# Patient Record
Sex: Female | Born: 1970 | Race: White | Hispanic: No | Marital: Married | Smoking: Never smoker
Health system: Southern US, Community
[De-identification: ages and names within clinical notes are randomized; demographics above are authoritative.]

## PROBLEM LIST (undated history)

## (undated) DIAGNOSIS — E785 Hyperlipidemia, unspecified: Secondary | ICD-10-CM

## (undated) DIAGNOSIS — F32A Depression, unspecified: Secondary | ICD-10-CM

## (undated) DIAGNOSIS — K5792 Diverticulitis of intestine, part unspecified, without perforation or abscess without bleeding: Secondary | ICD-10-CM

## (undated) DIAGNOSIS — R079 Chest pain, unspecified: Secondary | ICD-10-CM

## (undated) DIAGNOSIS — F419 Anxiety disorder, unspecified: Secondary | ICD-10-CM

## (undated) DIAGNOSIS — J40 Bronchitis, not specified as acute or chronic: Secondary | ICD-10-CM

## (undated) DIAGNOSIS — M549 Dorsalgia, unspecified: Secondary | ICD-10-CM

## (undated) DIAGNOSIS — E039 Hypothyroidism, unspecified: Secondary | ICD-10-CM

## (undated) DIAGNOSIS — F329 Major depressive disorder, single episode, unspecified: Secondary | ICD-10-CM

## (undated) DIAGNOSIS — J309 Allergic rhinitis, unspecified: Secondary | ICD-10-CM

## (undated) HISTORY — DX: Allergic rhinitis, unspecified: J30.9

## (undated) HISTORY — DX: Chest pain, unspecified: R07.9

## (undated) HISTORY — PX: BACK SURGERY: SHX140

## (undated) HISTORY — DX: Bronchitis, not specified as acute or chronic: J40

## (undated) HISTORY — DX: Hypothyroidism, unspecified: E03.9

---

## 1998-11-01 ENCOUNTER — Emergency Department (HOSPITAL_COMMUNITY): Admission: EM | Admit: 1998-11-01 | Discharge: 1998-11-01 | Payer: Self-pay | Admitting: Emergency Medicine

## 1999-07-07 ENCOUNTER — Other Ambulatory Visit: Admission: RE | Admit: 1999-07-07 | Discharge: 1999-07-07 | Payer: Self-pay | Admitting: Obstetrics and Gynecology

## 1999-11-09 ENCOUNTER — Emergency Department (HOSPITAL_COMMUNITY): Admission: EM | Admit: 1999-11-09 | Discharge: 1999-11-09 | Payer: Self-pay | Admitting: Emergency Medicine

## 2000-10-07 ENCOUNTER — Other Ambulatory Visit: Admission: RE | Admit: 2000-10-07 | Discharge: 2000-10-07 | Payer: Self-pay | Admitting: Obstetrics and Gynecology

## 2002-08-31 ENCOUNTER — Other Ambulatory Visit: Admission: RE | Admit: 2002-08-31 | Discharge: 2002-08-31 | Payer: Self-pay | Admitting: Obstetrics and Gynecology

## 2002-09-08 ENCOUNTER — Encounter: Payer: Self-pay | Admitting: Emergency Medicine

## 2002-09-08 ENCOUNTER — Emergency Department (HOSPITAL_COMMUNITY): Admission: EM | Admit: 2002-09-08 | Discharge: 2002-09-09 | Payer: Self-pay | Admitting: Emergency Medicine

## 2003-10-18 ENCOUNTER — Other Ambulatory Visit: Admission: RE | Admit: 2003-10-18 | Discharge: 2003-10-18 | Payer: Self-pay | Admitting: Obstetrics and Gynecology

## 2006-11-29 ENCOUNTER — Inpatient Hospital Stay (HOSPITAL_COMMUNITY): Admission: RE | Admit: 2006-11-29 | Discharge: 2006-12-02 | Payer: Self-pay | Admitting: Neurosurgery

## 2008-11-14 ENCOUNTER — Encounter: Admission: RE | Admit: 2008-11-14 | Discharge: 2008-11-14 | Payer: Self-pay | Admitting: Emergency Medicine

## 2010-02-11 ENCOUNTER — Encounter: Admission: RE | Admit: 2010-02-11 | Discharge: 2010-02-11 | Payer: Self-pay | Admitting: Otolaryngology

## 2010-05-06 ENCOUNTER — Inpatient Hospital Stay (HOSPITAL_COMMUNITY)
Admission: EM | Admit: 2010-05-06 | Discharge: 2010-05-13 | Payer: Self-pay | Source: Home / Self Care | Admitting: Emergency Medicine

## 2010-05-08 ENCOUNTER — Encounter (INDEPENDENT_AMBULATORY_CARE_PROVIDER_SITE_OTHER): Payer: Self-pay | Admitting: Gastroenterology

## 2010-10-29 LAB — DIFFERENTIAL
Basophils Absolute: 0 10*3/uL (ref 0.0–0.1)
Basophils Absolute: 0 10*3/uL (ref 0.0–0.1)
Basophils Relative: 0 % (ref 0–1)
Basophils Relative: 0 % (ref 0–1)
Basophils Relative: 1 % (ref 0–1)
Eosinophils Absolute: 0 10*3/uL (ref 0.0–0.7)
Lymphocytes Relative: 26 % (ref 12–46)
Lymphs Abs: 1.5 10*3/uL (ref 0.7–4.0)
Lymphs Abs: 1.6 10*3/uL (ref 0.7–4.0)
Monocytes Absolute: 0.8 10*3/uL (ref 0.1–1.0)
Monocytes Relative: 1 % — ABNORMAL LOW (ref 3–12)
Neutro Abs: 5.3 10*3/uL (ref 1.7–7.7)
Neutro Abs: 8.4 10*3/uL — ABNORMAL HIGH (ref 1.7–7.7)
Neutrophils Relative %: 64 % (ref 43–77)
Neutrophils Relative %: 70 % (ref 43–77)
Neutrophils Relative %: 83 % — ABNORMAL HIGH (ref 43–77)

## 2010-10-29 LAB — URINALYSIS, ROUTINE W REFLEX MICROSCOPIC
Bilirubin Urine: NEGATIVE
Specific Gravity, Urine: 1.011 (ref 1.005–1.030)
Urobilinogen, UA: 0.2 mg/dL (ref 0.0–1.0)
pH: 6.5 (ref 5.0–8.0)

## 2010-10-29 LAB — HEPATIC FUNCTION PANEL
AST: 21 U/L (ref 0–37)
Albumin: 3.6 g/dL (ref 3.5–5.2)
Alkaline Phosphatase: 45 U/L (ref 39–117)
Total Bilirubin: 0.2 mg/dL — ABNORMAL LOW (ref 0.3–1.2)

## 2010-10-29 LAB — COMPREHENSIVE METABOLIC PANEL
ALT: 17 U/L (ref 0–35)
Albumin: 2.7 g/dL — ABNORMAL LOW (ref 3.5–5.2)
Alkaline Phosphatase: 35 U/L — ABNORMAL LOW (ref 39–117)
BUN: 3 mg/dL — ABNORMAL LOW (ref 6–23)
CO2: 27 mEq/L (ref 19–32)
Calcium: 8.8 mg/dL (ref 8.4–10.5)
Chloride: 105 mEq/L (ref 96–112)
Creatinine, Ser: 1.13 mg/dL (ref 0.4–1.2)
GFR calc non Af Amer: >60 mL/min (ref >60)
Glucose, Bld: 108 mg/dL — ABNORMAL HIGH (ref 70–99)
Glucose, Bld: 119 mg/dL — ABNORMAL HIGH (ref 70–99)
Sodium: 139 mEq/L (ref 135–145)
Total Bilirubin: 0.2 mg/dL — ABNORMAL LOW (ref 0.3–1.2)
Total Bilirubin: 0.4 mg/dL (ref 0.3–1.2)
Total Protein: 6 g/dL (ref 6.0–8.3)

## 2010-10-29 LAB — CBC
HCT: 33 % — ABNORMAL LOW (ref 36.0–46.0)
HCT: 34.2 % — ABNORMAL LOW (ref 36.0–46.0)
Hemoglobin: 11.8 g/dL — ABNORMAL LOW (ref 12.0–15.0)
Hemoglobin: 13.8 g/dL (ref 12.0–15.0)
MCH: 28.6 pg (ref 26.0–34.0)
MCHC: 34.4 g/dL (ref 30.0–36.0)
MCV: 84.1 fL (ref 78.0–100.0)
Platelets: 301 10*3/uL (ref 150–400)
RBC: 4.72 MIL/uL (ref 3.87–5.11)
RDW: 12.7 % (ref 11.5–15.5)

## 2010-10-29 LAB — BASIC METABOLIC PANEL
CO2: 28 mEq/L (ref 19–32)
Calcium: 8.5 mg/dL (ref 8.4–10.5)
Calcium: 8.8 mg/dL (ref 8.4–10.5)
Calcium: 9 mg/dL (ref 8.4–10.5)
Creatinine, Ser: 0.87 mg/dL (ref 0.4–1.2)
GFR calc Af Amer: >60 mL/min (ref >60)
GFR calc Af Amer: >60 mL/min (ref >60)
GFR calc Af Amer: >60 mL/min (ref >60)
GFR calc non Af Amer: >60 mL/min (ref >60)
GFR calc non Af Amer: >60 mL/min (ref >60)
GFR calc non Af Amer: >60 mL/min (ref >60)
Glucose, Bld: 95 mg/dL (ref 70–99)
Potassium: 2.9 mEq/L — ABNORMAL LOW (ref 3.5–5.1)
Sodium: 135 mEq/L (ref 135–145)
Sodium: 141 mEq/L (ref 135–145)
Sodium: 141 mEq/L (ref 135–145)

## 2010-10-29 LAB — STOOL CULTURE

## 2010-10-29 LAB — URINE CULTURE
Colony Count: NO GROWTH
Culture: NO GROWTH

## 2010-10-29 LAB — HEMOCCULT GUIAC POC 1CARD (OFFICE)
Fecal Occult Bld: NEGATIVE
Fecal Occult Bld: NEGATIVE

## 2010-10-29 LAB — CLOSTRIDIUM DIFFICILE BY PCR: Toxigenic C. Difficile by PCR: NOT DETECTED

## 2010-10-29 LAB — CLOSTRIDIUM DIFFICILE EIA

## 2010-10-29 LAB — URINE MICROSCOPIC-ADD ON

## 2011-01-01 NOTE — Op Note (Signed)
NAMESHERRAN, MARGOLIS               ACCOUNT NO.:  [id number]   MEDICAL RECORD NO.:  [id number]          PATIENT TYPE:  INP   LOCATION:  2899                         FACILITY:  [facility]   PHYSICIAN:  Danae Orleans. Venetia Maxon, M.D.  DATE OF BIRTH:  1970-12-26   DATE OF PROCEDURE:  11/29/2006  DATE OF DISCHARGE:                               OPERATIVE REPORT   PREOPERATIVE DIAGNOSES:  1. L5-S1 spondylolisthesis with spondylosis.  2. Degenerative disk disease.  3. Stenosis and radiculopathy.   POSTOPERATIVE DIAGNOSES:  1. L5-S1 spondylolisthesis with spondylosis.  2. Degenerative disk disease.  3. Stenosis and radiculopathy.   OPERATION/PROCEDURE:  1. L5 Gill procedure.  2. to L5-S1 diskectomy.  3. L5-S1 transforaminal lumbar interbody fusion with PEEK interbody      cage of morcellized bone autograft.  4. Nonsegmental pedicle screw fixation L5-S1 levels bilaterally.  5. Posterolateral arthrodesis.   SURGEON:  Danae Orleans. Venetia Maxon, M.D.   ASSISTANT:  Georgiann Cocker, RN.  Hewitt Shorts, M.D.   ANESTHESIA:  General endotracheal anesthesia.   ESTIMATED BLOOD LOSS:  250 mL.   COMPLICATIONS:  None.   DISPOSITION:  To recovery.   INDICATIONS:  Breanna [name] is a 40 year old woman with grade 2  spondylolisthesis of L5-S1 with spondolysis of L5 and significant  foraminal disk herniation, right greater than left causing right L5  radiculopathy and to a lesser degree, left-sided leg pain.  It was  elected to take her to surgery for decompression and fusion of this  effected level.   DESCRIPTION OF PROCEDURE:  The patient was brought to the operating  room.  Following satisfactory, uncomplicated induction of general  endotracheal anesthesia plus intravenous lines, the patient was placed  in a prone position on the chest rolls.  Her low back was then prepped  and draped in the usual sterile fashion.  The area of planned incision  was infiltrated with 0.25% Marcaine and lidocaine with  1:200,000  epinephrine.   Incision was made in the midline of the low back and carried to the  lumbodorsal fascia which was approximately 5 inches in size.  The  subperiosteal dissection was performed exposing the bilateral sacral  alae and the L5 the transverse processes bilaterally.  Self-retaining  retractor was placed to facilitate exposure.  Intraoperative x-rays was  performed and confirmed correct level.  Subsequently, and L5 Gill  procedure was performed with the removal of the posterior elements of L5  five which were completely loose.  These were then removed and bone was  then cleared of investing soft tissue and prepared for later use in bone  grafting.  Both the L5 nerve roots very carefully dissected free of  investing tissue and they were decompressed widely as it extended out  the neural foramina bilaterally.  Subsequently on the right side of  midline the interspace was incised and this radical diskectomy was  performed along with preparing the end plates of both L5 and of S1 for  later  graft.  After trial sizing, elected to use a 9-mm TLIF  interbody  cage which was selected, packed with morcellized bone  autograft and  ostia cell.  Additionally, additional bone graft was placed deep in the  interspace and subsequently the interbody graft was placed and rotated  to a transverse position and then additional bone graft was placed in  the interspace and tamped into position.  Subsequently, pedicle screw  fixation was placed at L5 through S1 levels bilaterally using  fluoroscopy and confirming positioning of the screws on the AP and  lateral films.  Also there was no evidence of any cutouts on palpation  with ball probe on retracted pedicle screws. The 45 x 6.5 mL sacral  screws were placed and 50 mm x 6.5-mm screws were placed at the level of  L5-S1.  All screws had excellent purchase and their positioning appeared  to be solidly in bone.  Bicortical fixation was achieved  in the sacrum.  Subsequently, the lateral recesses __________ to the posterolateral  region of L5-S1 had been decorded prior to placing the screws and the  remaining bone graft was packed on the left side midline with bone tamp.  35-mm rods placed and locked in situ with a locking cap __________ .  Subsequently the self-retaining retractor was removed and the  lumbodorsal fascia was closed with #1 Vicryl sutures.  Subcutaneous  tissue were approximated 2-0 Vicryl interrupted inverted sutures and  skin edges were reapproximated with interrupted 3-0 Vicryl subcu stitch.  The wound was dressed with Benzoin, Steri-Strips, Telfa gauze, and tape.  The patient was extubated in the operating room and taken to the  recovery room recovery in stable and satisfactory condition.      Danae Orleans. Venetia Maxon, M.D.  Electronically Signed     JDS/MEDQ  D:  11/29/2006  T:  11/29/2006  Job:  [id number]

## 2011-01-01 NOTE — Discharge Summary (Signed)
[name], [name]               ACCOUNT NO.:  [id number]   MEDICAL RECORD NO.:  [id number]          PATIENT TYPE:  INP   LOCATION:  3035                         FACILITY:  [facility]   PHYSICIAN:  Danae Orleans. Venetia Maxon, M.D.  DATE OF BIRTH:  March 13, 1971   DATE OF ADMISSION:  11/29/2006  DATE OF DISCHARGE:  12/02/2006                               DISCHARGE SUMMARY   REASON FOR ADMISSION:  1. Lumbar spondylolisthesis.  2. Lumbar spondylosis.  3. Lumbar disk degeneration.   ADDITIONAL DIAGNOSES:  1. Tachycardia, NOS.  2. Obesity.  3. Unspecified asthma without status asthmaticus.  4. Esophageal reflux.  5. Panexs disorder.   FINAL DIAGNOSES:  1. Lumbar spondylolisthesis.  2. Lumbar spondylosis.  3. Lumbar disk degeneration.  4. Tachycardia, NOS.  5. Obesity.  6. Unspecified asthma without status asthmaticus.  7. Esophageal reflux.  8. Panexs disorder.   HISTORY OF ILLNESS AND HOSPITAL COURSE:  Breanna [name] is a 40 year old  woman with low back and right leg pain.  She has grade 2  spondylolisthesis of L5 and S1 with marked foraminal stenosis on the  right of L5 and S1 and significant L5 radiculopathy.  She was admitted  to the hospital for L5 gill procedure, L5-S1 decompression and fusion.  Postoperatively, she did well.  She had some tachycardia.  She was  nauseated with morphine and changed to PCA with Dilaudid.  She was  gradually mobilized to physical therapy.  Her tachycardia improved.  She  was taking Percocet orally for pain and was doing well.  On the 18th,  she was discharged home with Percocet and Valium with a rolling walker  and instructions to follow up in the office in 3-4 weeks with lumbar  radiographs.  She was instructed to wear her brace when up.      Danae Orleans. Venetia Maxon, M.D.  Electronically Signed     JDS/MEDQ  D:  02/02/2007  T:  02/02/2007  Job:  [id number]

## 2011-08-26 ENCOUNTER — Emergency Department (HOSPITAL_BASED_OUTPATIENT_CLINIC_OR_DEPARTMENT_OTHER)
Admission: EM | Admit: 2011-08-26 | Discharge: 2011-08-26 | Disposition: A | Discharge status: Home / Self Care | Payer: BC Managed Care – PPO | Attending: Emergency Medicine | Admitting: Emergency Medicine

## 2011-08-26 ENCOUNTER — Encounter (HOSPITAL_BASED_OUTPATIENT_CLINIC_OR_DEPARTMENT_OTHER): Payer: Self-pay | Admitting: *Deleted

## 2011-08-26 ENCOUNTER — Other Ambulatory Visit: Payer: Self-pay

## 2011-08-26 ENCOUNTER — Emergency Department (INDEPENDENT_AMBULATORY_CARE_PROVIDER_SITE_OTHER): Payer: BC Managed Care – PPO

## 2011-08-26 DIAGNOSIS — R079 Chest pain, unspecified: Secondary | ICD-10-CM

## 2011-08-26 DIAGNOSIS — F341 Dysthymic disorder: Secondary | ICD-10-CM | POA: Insufficient documentation

## 2011-08-26 DIAGNOSIS — K219 Gastro-esophageal reflux disease without esophagitis: Secondary | ICD-10-CM | POA: Insufficient documentation

## 2011-08-26 DIAGNOSIS — E785 Hyperlipidemia, unspecified: Secondary | ICD-10-CM | POA: Insufficient documentation

## 2011-08-26 DIAGNOSIS — R55 Syncope and collapse: Secondary | ICD-10-CM | POA: Insufficient documentation

## 2011-08-26 HISTORY — DX: Major depressive disorder, single episode, unspecified: F32.9

## 2011-08-26 HISTORY — DX: Dorsalgia, unspecified: M54.9

## 2011-08-26 HISTORY — DX: Depression, unspecified: F32.A

## 2011-08-26 HISTORY — DX: Anxiety disorder, unspecified: F41.9

## 2011-08-26 HISTORY — DX: Hyperlipidemia, unspecified: E78.5

## 2011-08-26 LAB — COMPREHENSIVE METABOLIC PANEL
ALT: 12 U/L (ref 0–35)
Albumin: 3.8 g/dL (ref 3.5–5.2)
Calcium: 9.3 mg/dL (ref 8.4–10.5)
GFR calc Af Amer: >90 mL/min (ref >90)
Glucose, Bld: 93 mg/dL (ref 70–99)
Sodium: 140 mEq/L (ref 135–145)
Total Protein: 7.2 g/dL (ref 6.0–8.3)

## 2011-08-26 LAB — CARDIAC PANEL(CRET KIN+CKTOT+MB+TROPI)
CK, MB: 3.4 ng/mL (ref 0.3–4.0)
CK, MB: 3.1 ng/mL (ref 0.3–4.0)
Total CK: 263 U/L — ABNORMAL HIGH (ref 7–177)
Troponin I: <0.3 ng/mL (ref <0.30)
Troponin I: <0.3 ng/mL (ref <0.30)

## 2011-08-26 LAB — CBC
Hemoglobin: 12.2 g/dL (ref 12.0–15.0)
MCH: 27.5 pg (ref 26.0–34.0)
MCHC: 33.2 g/dL (ref 30.0–36.0)
Platelets: 372 10*3/uL (ref 150–400)
RDW: 13.9 % (ref 11.5–15.5)

## 2011-08-26 LAB — URINALYSIS, ROUTINE W REFLEX MICROSCOPIC
Bilirubin Urine: NEGATIVE
Leukocytes, UA: NEGATIVE
Nitrite: NEGATIVE
Specific Gravity, Urine: 1.006 (ref 1.005–1.030)
pH: 8 (ref 5.0–8.0)

## 2011-08-26 LAB — URINE MICROSCOPIC-ADD ON

## 2011-08-26 MED ORDER — POTASSIUM CHLORIDE CRYS ER 20 MEQ PO TBCR
40.0000 meq | EXTENDED_RELEASE_TABLET | Freq: Once | ORAL | Status: AC
  Administered 2011-08-26: 40 meq via ORAL
  Filled 2011-08-26: qty 2

## 2011-08-26 MED ORDER — FAMOTIDINE 20 MG PO TABS: 20.0000 mg | ORAL_TABLET | Freq: Two times a day (BID) | ORAL | Status: DC

## 2011-08-26 MED ORDER — FAMOTIDINE IN NACL 20-0.9 MG/50ML-% IV SOLN
20.0000 mg | Freq: Once | INTRAVENOUS | Status: AC
  Administered 2011-08-26: 20 mg via INTRAVENOUS
  Filled 2011-08-26: qty 50

## 2011-08-26 NOTE — ED Notes (Signed)
Brought  In by EMS for c/o chest pain x 45 mins

## 2011-08-26 NOTE — ED Notes (Signed)
Pt received 1 nitro in rout with asa 324mg 

## 2011-08-26 NOTE — ED Provider Notes (Signed)
History     CSN: [id number]  Arrival date & time 08/26/11  1419   First MD Initiated Contact with Patient 08/26/11 1452      Chief Complaint  Patient presents with  . Chest Pain    (Consider location/radiation/quality/duration/timing/severity/associated sxs/prior treatment) HPI Comments: Sx began when at work.  States that she was seated when the pain began.  Remained localized to the areas described  Patient is a 41 y.o. female presenting with chest pain. The history is provided by the patient. No language interpreter was used.  Chest Pain The chest pain began 1 - 2 hours ago. Duration of episode(s) is 30 minutes. Chest pain occurs constantly. The chest pain is improving. The severity of the pain is moderate. The quality of the pain is described as pressure-like and aching (L and substernal chest/epigastrium). The pain does not radiate. Primary symptoms include palpitations. Pertinent negatives for primary symptoms include no fever, no fatigue, no syncope, no shortness of breath, no cough, no wheezing, no abdominal pain, no nausea, no vomiting, no dizziness and no altered mental status.  The palpitations did not occur with dizziness or shortness of breath.  Associated symptoms include near-syncope.  Pertinent negatives for associated symptoms include no diaphoresis, no lower extremity edema, no numbness, no orthopnea and no weakness. She tried nitroglycerin and aspirin for the symptoms. Risk factors include obesity.     Past Medical History  Diagnosis Date  . Anxiety   . Depressed   . Back pain   . Hyperlipemia     History reviewed. No pertinent past surgical history.  History reviewed. No pertinent family history.  History  Substance Use Topics  . Smoking status: Never Smoker   . Smokeless tobacco: Not on file  . Alcohol Use: No    OB History    Grav Para Term Preterm Abortions TAB SAB Ect Mult Living                  Review of Systems  Constitutional: Negative  for fever, diaphoresis, activity change, appetite change and fatigue.  HENT: Positive for congestion. Negative for sore throat, rhinorrhea, neck pain and neck stiffness.   Respiratory: Negative for cough, shortness of breath and wheezing.   Cardiovascular: Positive for chest pain, palpitations and near-syncope. Negative for orthopnea and syncope.  Gastrointestinal: Negative for nausea, vomiting and abdominal pain.  Genitourinary: Negative for dysuria, urgency, frequency and flank pain.  Neurological: Negative for dizziness, weakness, light-headedness, numbness and headaches.  Psychiatric/Behavioral: Negative for altered mental status.  All other systems reviewed and are negative.    Allergies  Review of patient's allergies indicates no known allergies.  Home Medications   Current Outpatient Rx  Name Route Sig Dispense Refill  . AMITRIPTYLINE HCL 50 MG PO TABS Oral Take 50 mg by mouth at bedtime.    . BUPROPION HCL 75 MG PO TABS Oral Take 75 mg by mouth 2 (two) times daily.    Marland Kitchen CETIRIZINE HCL 10 MG PO TABS Oral Take 10 mg by mouth daily.    . ADULT MULTIVITAMIN W/MINERALS CH Oral Take 1 tablet by mouth daily.    Marland Kitchen OMEPRAZOLE 20 MG PO CPDR Oral Take 20 mg by mouth daily.    Marland Kitchen OVER THE COUNTER MEDICATION Oral Take 2 tablets by mouth daily. Acetaminophen 325 mg, Guaifenesis 200 mg, and Phenylephrine 5 mg    . SIMVASTATIN 40 MG PO TABS Oral Take 40 mg by mouth every evening.    Marland Kitchen FAMOTIDINE 20 MG  PO TABS Oral Take 1 tablet (20 mg total) by mouth 2 (two) times daily. 30 tablet 0    BP 156/90  Pulse 96  Temp(Src) 98.5 F (36.9 C) (Oral)  Resp 18  SpO2 100%  LMP 08/24/2011  Physical Exam  Nursing note and vitals reviewed. Constitutional: She is oriented to person, place, and time. She appears well-developed and well-nourished. No distress.  HENT:  Head: Normocephalic and atraumatic.  Mouth/Throat: Oropharynx is clear and moist.  Eyes: Conjunctivae and EOM are normal. Pupils are  equal, round, and reactive to light.  Neck: Normal range of motion. Neck supple.  Cardiovascular: Normal rate, regular rhythm, normal heart sounds and intact distal pulses.  Exam reveals no gallop and no friction rub.   No murmur heard. Pulmonary/Chest: Effort normal and breath sounds normal. No respiratory distress. She has no wheezes. She exhibits no tenderness.  Abdominal: Soft. Bowel sounds are normal. There is no tenderness.  Musculoskeletal: Normal range of motion. She exhibits no edema and no tenderness.  Neurological: She is alert and oriented to person, place, and time. No cranial nerve deficit.  Skin: Skin is warm and dry. No rash noted.    ED Course  Procedures (including critical care time)   Date: 08/26/2011  Rate: 94  Rhythm: normal sinus rhythm  QRS Axis: normal  Intervals: normal  ST/T Wave abnormalities: normal  Conduction Disutrbances:none  Narrative Interpretation:   Old EKG Reviewed: none available  Labs Reviewed  CARDIAC PANEL(CRET KIN+CKTOT+MB+TROPI) - Abnormal; Notable for the following:    Total CK 263 (*)    All other components within normal limits  COMPREHENSIVE METABOLIC PANEL - Abnormal; Notable for the following:    Potassium 3.2 (*)    Total Bilirubin 0.2 (*)    All other components within normal limits  URINALYSIS, ROUTINE W REFLEX MICROSCOPIC - Abnormal; Notable for the following:    Hgb urine dipstick MODERATE (*)    All other components within normal limits  CARDIAC PANEL(CRET KIN+CKTOT+MB+TROPI) - Abnormal; Notable for the following:    Total CK 248 (*)    All other components within normal limits  CBC  URINE MICROSCOPIC-ADD ON   Dg Chest 2 View  08/26/2011  *RADIOLOGY REPORT*  Clinical Data: Mid chest pain.  Nonsmoker.  CHEST - 2 VIEW  Comparison: None.  Findings: Midline trachea.  Normal heart size and mediastinal contours. No pleural effusion or pneumothorax.  Clear lungs.  Numerous leads and wires project over the chest.  IMPRESSION:  Normal chest.  Original Report Authenticated By: Consuello Bossier, M.D.     1. Chest pain   2. GERD (gastroesophageal reflux disease)       MDM  Low concern for acute coronary syndrome as a cause for chest pain. EKG is normal. Delta troponin normal. Patient has perked negative with low clinical gestalt for PE. Chest x-ray also unremarkable. I feel her symptoms are secondary to anxiety as well as reflux. She'll be placed on Pepcid to be added to Prilosec. Should followup with primary care physician next week. Provided clear signs and symptoms for which to return to the emergency department        Dayton Bailiff, MD 08/26/11 1811

## 2014-02-27 ENCOUNTER — Other Ambulatory Visit: Payer: Self-pay | Admitting: Obstetrics and Gynecology

## 2014-02-27 DIAGNOSIS — N644 Mastodynia: Secondary | ICD-10-CM

## 2014-02-27 DIAGNOSIS — N6452 Nipple discharge: Secondary | ICD-10-CM

## 2014-03-07 ENCOUNTER — Ambulatory Visit
Admission: RE | Admit: 2014-03-07 | Discharge: 2014-03-07 | Disposition: A | Discharge status: Home / Self Care | Payer: BC Managed Care – PPO | Source: Ambulatory Visit | Attending: Obstetrics and Gynecology | Admitting: Obstetrics and Gynecology

## 2014-03-07 DIAGNOSIS — N6452 Nipple discharge: Secondary | ICD-10-CM

## 2014-03-07 DIAGNOSIS — N644 Mastodynia: Secondary | ICD-10-CM

## 2014-04-01 ENCOUNTER — Other Ambulatory Visit: Payer: Self-pay | Admitting: Obstetrics and Gynecology

## 2014-04-01 DIAGNOSIS — R7989 Other specified abnormal findings of blood chemistry: Secondary | ICD-10-CM

## 2014-04-01 DIAGNOSIS — E229 Hyperfunction of pituitary gland, unspecified: Principal | ICD-10-CM

## 2014-04-04 ENCOUNTER — Ambulatory Visit
Admission: RE | Admit: 2014-04-04 | Discharge: 2014-04-04 | Disposition: A | Discharge status: Home / Self Care | Payer: BC Managed Care – PPO | Source: Ambulatory Visit | Attending: Obstetrics and Gynecology | Admitting: Obstetrics and Gynecology

## 2014-04-04 DIAGNOSIS — R7989 Other specified abnormal findings of blood chemistry: Secondary | ICD-10-CM

## 2014-04-04 DIAGNOSIS — E229 Hyperfunction of pituitary gland, unspecified: Principal | ICD-10-CM

## 2014-04-04 MED ORDER — GADOBENATE DIMEGLUMINE 529 MG/ML IV SOLN
8.0000 mL | Freq: Once | INTRAVENOUS | Status: AC | PRN
  Administered 2014-04-04: 8 mL via INTRAVENOUS

## 2014-11-21 ENCOUNTER — Other Ambulatory Visit: Payer: Self-pay | Admitting: Endocrinology

## 2014-11-21 DIAGNOSIS — D444 Neoplasm of uncertain behavior of craniopharyngeal duct: Principal | ICD-10-CM

## 2014-11-21 DIAGNOSIS — D443 Neoplasm of uncertain behavior of pituitary gland: Secondary | ICD-10-CM

## 2014-12-01 ENCOUNTER — Ambulatory Visit
Admission: RE | Admit: 2014-12-01 | Discharge: 2014-12-01 | Disposition: A | Discharge status: Home / Self Care | Payer: BC Managed Care – PPO | Source: Ambulatory Visit | Attending: Endocrinology | Admitting: Endocrinology

## 2014-12-01 DIAGNOSIS — D443 Neoplasm of uncertain behavior of pituitary gland: Secondary | ICD-10-CM

## 2014-12-01 DIAGNOSIS — D444 Neoplasm of uncertain behavior of craniopharyngeal duct: Principal | ICD-10-CM

## 2014-12-01 MED ORDER — GADOBENATE DIMEGLUMINE 529 MG/ML IV SOLN
9.0000 mL | Freq: Once | INTRAVENOUS | Status: AC | PRN
  Administered 2014-12-01: 9 mL via INTRAVENOUS

## 2015-05-20 ENCOUNTER — Other Ambulatory Visit: Payer: Self-pay | Admitting: Obstetrics and Gynecology

## 2015-05-21 LAB — CYTOLOGY - PAP

## 2016-12-17 ENCOUNTER — Other Ambulatory Visit: Payer: Self-pay | Admitting: Endocrinology

## 2016-12-17 DIAGNOSIS — D443 Neoplasm of uncertain behavior of pituitary gland: Secondary | ICD-10-CM

## 2016-12-17 DIAGNOSIS — D444 Neoplasm of uncertain behavior of craniopharyngeal duct: Principal | ICD-10-CM

## 2016-12-28 ENCOUNTER — Other Ambulatory Visit: Payer: BC Managed Care – PPO

## 2017-01-03 ENCOUNTER — Ambulatory Visit
Admission: RE | Admit: 2017-01-03 | Discharge: 2017-01-03 | Disposition: A | Discharge status: Home / Self Care | Payer: BC Managed Care – PPO | Source: Ambulatory Visit | Attending: Endocrinology | Admitting: Endocrinology

## 2017-01-03 DIAGNOSIS — D444 Neoplasm of uncertain behavior of craniopharyngeal duct: Principal | ICD-10-CM

## 2017-01-03 DIAGNOSIS — D443 Neoplasm of uncertain behavior of pituitary gland: Secondary | ICD-10-CM

## 2017-01-03 MED ORDER — GADOBENATE DIMEGLUMINE 529 MG/ML IV SOLN
8.0000 mL | Freq: Once | INTRAVENOUS | Status: AC | PRN
  Administered 2017-01-03: 8 mL via INTRAVENOUS

## 2017-01-26 ENCOUNTER — Ambulatory Visit
Admission: RE | Admit: 2017-01-26 | Discharge: 2017-01-26 | Disposition: A | Discharge status: Home / Self Care | Payer: BC Managed Care – PPO | Source: Ambulatory Visit | Attending: Allergy | Admitting: Allergy

## 2017-01-26 ENCOUNTER — Other Ambulatory Visit: Payer: Self-pay | Admitting: Allergy

## 2017-01-26 DIAGNOSIS — R059 Cough, unspecified: Secondary | ICD-10-CM

## 2017-01-26 DIAGNOSIS — R05 Cough: Secondary | ICD-10-CM

## 2017-02-03 ENCOUNTER — Encounter (HOSPITAL_COMMUNITY): Payer: Self-pay | Admitting: Emergency Medicine

## 2017-02-03 ENCOUNTER — Inpatient Hospital Stay (HOSPITAL_COMMUNITY)
Admission: EM | Admit: 2017-02-03 | Discharge: 2017-02-09 | DRG: 378 | Disposition: A | Discharge status: Home / Self Care | Payer: BC Managed Care – PPO | Attending: Family Medicine | Admitting: Family Medicine

## 2017-02-03 DIAGNOSIS — E876 Hypokalemia: Secondary | ICD-10-CM | POA: Diagnosis not present

## 2017-02-03 DIAGNOSIS — Z6833 Body mass index (BMI) 33.0-33.9, adult: Secondary | ICD-10-CM

## 2017-02-03 DIAGNOSIS — N3000 Acute cystitis without hematuria: Secondary | ICD-10-CM | POA: Diagnosis not present

## 2017-02-03 DIAGNOSIS — J45909 Unspecified asthma, uncomplicated: Secondary | ICD-10-CM | POA: Diagnosis present

## 2017-02-03 DIAGNOSIS — K922 Gastrointestinal hemorrhage, unspecified: Secondary | ICD-10-CM

## 2017-02-03 DIAGNOSIS — K5790 Diverticulosis of intestine, part unspecified, without perforation or abscess without bleeding: Secondary | ICD-10-CM | POA: Diagnosis present

## 2017-02-03 DIAGNOSIS — Z981 Arthrodesis status: Secondary | ICD-10-CM

## 2017-02-03 DIAGNOSIS — J029 Acute pharyngitis, unspecified: Secondary | ICD-10-CM | POA: Diagnosis present

## 2017-02-03 DIAGNOSIS — I959 Hypotension, unspecified: Secondary | ICD-10-CM | POA: Diagnosis present

## 2017-02-03 DIAGNOSIS — Z8249 Family history of ischemic heart disease and other diseases of the circulatory system: Secondary | ICD-10-CM

## 2017-02-03 DIAGNOSIS — N3090 Cystitis, unspecified without hematuria: Secondary | ICD-10-CM

## 2017-02-03 DIAGNOSIS — R1032 Left lower quadrant pain: Secondary | ICD-10-CM | POA: Diagnosis not present

## 2017-02-03 DIAGNOSIS — I82612 Acute embolism and thrombosis of superficial veins of left upper extremity: Secondary | ICD-10-CM | POA: Diagnosis not present

## 2017-02-03 DIAGNOSIS — Z800 Family history of malignant neoplasm of digestive organs: Secondary | ICD-10-CM | POA: Diagnosis not present

## 2017-02-03 DIAGNOSIS — D62 Acute posthemorrhagic anemia: Secondary | ICD-10-CM | POA: Diagnosis present

## 2017-02-03 DIAGNOSIS — M545 Low back pain, unspecified: Secondary | ICD-10-CM

## 2017-02-03 DIAGNOSIS — K921 Melena: Principal | ICD-10-CM | POA: Diagnosis present

## 2017-02-03 DIAGNOSIS — R55 Syncope and collapse: Secondary | ICD-10-CM

## 2017-02-03 DIAGNOSIS — G8929 Other chronic pain: Secondary | ICD-10-CM

## 2017-02-03 DIAGNOSIS — E039 Hypothyroidism, unspecified: Secondary | ICD-10-CM | POA: Diagnosis present

## 2017-02-03 DIAGNOSIS — K219 Gastro-esophageal reflux disease without esophagitis: Secondary | ICD-10-CM

## 2017-02-03 DIAGNOSIS — R109 Unspecified abdominal pain: Secondary | ICD-10-CM

## 2017-02-03 DIAGNOSIS — M79609 Pain in unspecified limb: Secondary | ICD-10-CM | POA: Diagnosis not present

## 2017-02-03 DIAGNOSIS — K317 Polyp of stomach and duodenum: Secondary | ICD-10-CM | POA: Diagnosis present

## 2017-02-03 HISTORY — DX: Diverticulitis of intestine, part unspecified, without perforation or abscess without bleeding: K57.92

## 2017-02-03 LAB — COMPREHENSIVE METABOLIC PANEL
ALBUMIN: 3.2 g/dL — AB (ref 3.5–5.0)
ALK PHOS: 55 U/L (ref 38–126)
ALT: 17 U/L (ref 14–54)
AST: 18 U/L (ref 15–41)
Anion gap: 7 (ref 5–15)
BILIRUBIN TOTAL: 0.4 mg/dL (ref 0.3–1.2)
BUN: 18 mg/dL (ref 6–20)
CALCIUM: 8.5 mg/dL — AB (ref 8.9–10.3)
CO2: 22 mmol/L (ref 22–32)
CREATININE: 0.73 mg/dL (ref 0.44–1.00)
Chloride: 109 mmol/L (ref 101–111)
GFR calc Af Amer: >60 mL/min (ref >60)
GLUCOSE: 110 mg/dL — AB (ref 65–99)
Potassium: 3.8 mmol/L (ref 3.5–5.1)
Sodium: 138 mmol/L (ref 135–145)
TOTAL PROTEIN: 5.9 g/dL — AB (ref 6.5–8.1)

## 2017-02-03 LAB — CBC WITH DIFFERENTIAL/PLATELET
BASOS ABS: 0.1 10*3/uL (ref 0.0–0.1)
BASOS PCT: 0 %
Eosinophils Absolute: 0.2 10*3/uL (ref 0.0–0.7)
Eosinophils Relative: 2 %
HEMATOCRIT: 31.6 % — AB (ref 36.0–46.0)
Hemoglobin: 10.1 g/dL — ABNORMAL LOW (ref 12.0–15.0)
LYMPHS PCT: 21 %
Lymphs Abs: 2.4 10*3/uL (ref 0.7–4.0)
MCH: 27.7 pg (ref 26.0–34.0)
MCHC: 32 g/dL (ref 30.0–36.0)
MCV: 86.8 fL (ref 78.0–100.0)
MONO ABS: 0.5 10*3/uL (ref 0.1–1.0)
Monocytes Relative: 4 %
NEUTROS ABS: 8.7 10*3/uL — AB (ref 1.7–7.7)
Neutrophils Relative %: 73 %
PLATELETS: 250 10*3/uL (ref 150–400)
RBC: 3.64 MIL/uL — AB (ref 3.87–5.11)
RDW: 13.6 % (ref 11.5–15.5)
WBC: 11.9 10*3/uL — AB (ref 4.0–10.5)

## 2017-02-03 LAB — URINALYSIS, ROUTINE W REFLEX MICROSCOPIC
BILIRUBIN URINE: NEGATIVE
GLUCOSE, UA: NEGATIVE mg/dL
Ketones, ur: NEGATIVE mg/dL
NITRITE: NEGATIVE
Protein, ur: NEGATIVE mg/dL
SPECIFIC GRAVITY, URINE: 1.026 (ref 1.005–1.030)
pH: 5 (ref 5.0–8.0)

## 2017-02-03 LAB — LIPASE, BLOOD: Lipase: 28 U/L (ref 11–51)

## 2017-02-03 LAB — TYPE AND SCREEN
ABO/RH(D): O POS
Antibody Screen: NEGATIVE

## 2017-02-03 LAB — I-STAT BETA HCG BLOOD, ED (MC, WL, AP ONLY)

## 2017-02-03 MED ORDER — ONDANSETRON HCL 4 MG/2ML IJ SOLN: 4.0000 mg | Freq: Four times a day (QID) | INTRAMUSCULAR | Status: DC | PRN

## 2017-02-03 MED ORDER — MONTELUKAST SODIUM 10 MG PO TABS
10.0000 mg | ORAL_TABLET | Freq: Every day | ORAL | Status: DC
  Administered 2017-02-04 – 2017-02-08 (×5): 10 mg via ORAL
  Filled 2017-02-03 (×5): qty 1

## 2017-02-03 MED ORDER — TOPIRAMATE 25 MG PO TABS
75.0000 mg | ORAL_TABLET | Freq: Two times a day (BID) | ORAL | Status: DC
  Administered 2017-02-04 – 2017-02-09 (×12): 75 mg via ORAL
  Filled 2017-02-03 (×12): qty 3

## 2017-02-03 MED ORDER — TRAMADOL HCL 50 MG PO TABS
50.0000 mg | ORAL_TABLET | Freq: Four times a day (QID) | ORAL | Status: DC | PRN
  Administered 2017-02-04 – 2017-02-07 (×5): 50 mg via ORAL
  Filled 2017-02-03 (×5): qty 1

## 2017-02-03 MED ORDER — LIDOCAINE VISCOUS 2 % MT SOLN
15.0000 mL | Freq: Once | OROMUCOSAL | Status: DC
  Filled 2017-02-03: qty 15

## 2017-02-03 MED ORDER — SODIUM CHLORIDE 0.9 % IV SOLN
80.0000 mg | Freq: Once | INTRAVENOUS | Status: AC
  Administered 2017-02-03: 80 mg via INTRAVENOUS
  Filled 2017-02-03: qty 80

## 2017-02-03 MED ORDER — SODIUM CHLORIDE 0.9% FLUSH
3.0000 mL | Freq: Two times a day (BID) | INTRAVENOUS | Status: DC
  Administered 2017-02-04 – 2017-02-08 (×9): 3 mL via INTRAVENOUS

## 2017-02-03 MED ORDER — SODIUM CHLORIDE 0.9 % IV SOLN
INTRAVENOUS | Status: AC
  Administered 2017-02-04 (×2): via INTRAVENOUS

## 2017-02-03 MED ORDER — LIDOCAINE 5 % EX PTCH
1.0000 | MEDICATED_PATCH | Freq: Every day | CUTANEOUS | Status: DC | PRN
  Filled 2017-02-03: qty 1

## 2017-02-03 MED ORDER — FLUTICASONE PROPIONATE 50 MCG/ACT NA SUSP
1.0000 | Freq: Every day | NASAL | Status: DC
  Administered 2017-02-04 – 2017-02-09 (×6): 1 via NASAL
  Filled 2017-02-03: qty 16

## 2017-02-03 MED ORDER — SODIUM CHLORIDE 0.9 % IV BOLUS (SEPSIS)
1000.0000 mL | Freq: Once | INTRAVENOUS | Status: AC
  Administered 2017-02-03: 1000 mL via INTRAVENOUS

## 2017-02-03 MED ORDER — SODIUM CHLORIDE 0.9 % IV SOLN
8.0000 mg/h | INTRAVENOUS | Status: AC
  Administered 2017-02-03 – 2017-02-06 (×7): 8 mg/h via INTRAVENOUS
  Filled 2017-02-03 (×13): qty 80

## 2017-02-03 MED ORDER — LEVOTHYROXINE SODIUM 50 MCG PO TABS
50.0000 ug | ORAL_TABLET | Freq: Every day | ORAL | Status: DC
  Administered 2017-02-04 – 2017-02-09 (×6): 50 ug via ORAL
  Filled 2017-02-03 (×6): qty 1

## 2017-02-03 MED ORDER — ONDANSETRON HCL 4 MG PO TABS: 4.0000 mg | ORAL_TABLET | Freq: Four times a day (QID) | ORAL | Status: DC | PRN

## 2017-02-03 NOTE — H&P (Signed)
[facility] Admission History and Physical Service Pager: [phone]  Patient name: Breanna [name] Medical record number: [id number] Date of birth: [birth year] Age: 46 y.o. Gender: female  Primary Care Provider: Blair Heys, PA-C Consultants: Gastroenterology Code Status: Full   Chief Complaint: hematochezia  Assessment and Plan: Breanna [name] is a 46 y.o. female presenting with several episodes of BRBPR since this afternoon. PMH is significant for allergy-induced asthma, chronic low back pain s/p L5-S1 fusion in 2008, diverticulitis, hypothyroidism, endometriosis, GERD, and prolactinoma.   #GI bleed: 6 episodes since this afternoon with associated hypotension and episode of syncope. BP has responded well to IVFs. Frequency and amount of blood decreasing. Frank blood with rectal exam. Recent exposures to steroids and NSAIDs. EKG NSR. Hgb 10.1 in ED, down from 13.4 from CBC in 07/2016. Colonoscopy performed in 2017 and reported as normal, per patient. Sees Dr. Glennon Hamilton of [facility]. Lipase and LFTs wnl. Anticipate need for colonoscopy to assess for suspected lower GI bleed.  - Admit to SDU, attending Dr. Erin Hearing - Monitor on Telemetry - Insert additional large bore IV - s/p 2L fluid boluses in ED - Continue IVFs at 125 mL/hr - Continue IV protonix - Gastroenterology consulted, appreciate recommendations - Recheck CBC at midnight (4 hours from admission) - Recheck CBC at 5 am - Type and screen, PT-INR ordered in ED - NPO at midnight in case of GI procedure  #Abdominal pain: History of diverticulitis. Never been hospitalized for this before. Current discomfort feels similar to past episodes. Completed course of augmentin 4 days ago for sinusitis. Has been treated for diverticulitis in past with cipro. Has been tolerating diet recently. Mild leukocytosis of 11.9 on admission.  - Continue to monitor - Tramadol 50 mg q6h prn - Trend WBC  #GERD: Has been on  aciphex - IV protonix  #Allergies: Recently started on breo inhaler but requests not to continue while hospitalized, as thinks it has contributed to recent sore throat.  - Continue home singulair - Continue home flonase  #Chronic low back pain: Follows with a pain management clinic. Has lidocaine patches and hydrocodone-acetaminophen as home medications, though last dose of the latter was 2 weeks ago. - Ordered home lidoderm patch  #Persistent cough: Recent CXR performed for persistent cough and was negative. In setting of nasal congestion suspect post-nasal drip.  - Continue home flonase  #Hypothyroidism: - Continue home synthroid 50 mcg  #Prolactinoma: Now asymptomatic. Says she recently stopped treatment for this. Last MRI 01/03/17 showed no pituitary mass.   FEN/GI: NPO at midnight, IV protonix drip Prophylaxis: SCDs  Disposition: Home pending resolution of acute bleed  History of Present Illness:  Breanna [name] is a 46 y.o. female presenting with BRBPR that began this afternoon. Patient said she first had a darker BM around 2 pm then another with BRB and then one that filled the toilet bowl with blood. She felt like she was going to pass out. When her husband came home, she was clammy and cold, so he started driving her to the hospital. She started mumbling and seemed to pass out in the car, so he called 911 and met EMS on the highway who completed transfer to [facility]. Husband thinks she was out about 5-6 minutes. Patient does not remember passing out. She reports 6 total episodes of passing bright red blood per rectum, 2 of which were in the ED. She has been taking ibuprofen and advil -- about 4 tablets a day -- in  the setting of sore throat over the last 2 weeks. She also received a depomedrol injection about 3 weeks ago for bronchitis. She began having abdominal pain about 4 days ago that feels like her normal diverticulitis pain; denies ever needing to be hospitalized for diverticulitis  or previous GI bleed. Rare EtOH use. Has been cycling between constipation and diarrhea.   Additionally, patient reports anemia in the past with menorrhagia but is now postmenopausal. Remote history of external hemorrhoids.   Per EMS, patient's BP was 90/62 after a 600 mL fluid bolus. She received 2 more liters of fluid bolus in ED and was feeling back to her normal self.   Review Of Systems: Per HPI with the following additions:   Review of Systems  Constitutional: Positive for fever (low grade) and malaise/fatigue. Negative for chills.  HENT: Positive for congestion, sinus pain and sore throat. Negative for nosebleeds.   Eyes: Negative for pain and discharge.  Respiratory: Positive for cough and shortness of breath.   Cardiovascular: Negative for chest pain and leg swelling.  Gastrointestinal: Positive for abdominal pain, blood in stool, constipation and diarrhea. Negative for nausea and vomiting.  Genitourinary: Negative for dysuria and frequency.  Musculoskeletal: Positive for back pain (chronic). Negative for falls.  Skin: Negative for rash.  Neurological: Positive for dizziness and weakness. Negative for focal weakness and headaches.    There are no active problems to display for this patient.   Past Medical History: Past Medical History:  Diagnosis Date  . Anxiety   . Back pain   . Depressed   . Diverticulitis   . Hyperlipemia    Past Surgical History: History reviewed. No pertinent surgical history.  Denies any abdominal surgeries.   Social History: Social History  Substance Use Topics  . Smoking status: Never Smoker  . Smokeless tobacco: Never Used  . Alcohol use No   Additional social history: Lives with husband. Has 2 children in their 68s who live outside of the home. Works as a Pharmacist, [employer]. May have a glass of wine when out to dinner rarely.  Please also refer to relevant sections of EMR.  Family History: No family history on  file. Mother and father with hypertension Grandfather had colon cancer  Allergies and Medications: No Known Allergies No current facility-administered medications on file prior to encounter.    Current Outpatient Prescriptions on File Prior to Encounter  Medication Sig Dispense Refill  . amitriptyline (ELAVIL) 50 MG tablet Take 50 mg by mouth at bedtime.    Marland Kitchen buPROPion (WELLBUTRIN) 75 MG tablet Take 75 mg by mouth 2 (two) times daily.    . cetirizine (ZYRTEC) 10 MG tablet Take 10 mg by mouth daily.    . famotidine (PEPCID) 20 MG tablet Take 1 tablet (20 mg total) by mouth 2 (two) times daily. 30 tablet 0  . Multiple Vitamin (MULITIVITAMIN WITH MINERALS) TABS Take 1 tablet by mouth daily.    Marland Kitchen omeprazole (PRILOSEC) 20 MG capsule Take 20 mg by mouth daily.    Marland Kitchen OVER THE COUNTER MEDICATION Take 2 tablets by mouth daily. Acetaminophen 325 mg, Guaifenesis 200 mg, and Phenylephrine 5 mg    . simvastatin (ZOCOR) 40 MG tablet Take 40 mg by mouth every evening.      Objective: BP 100/69   Pulse 84   Resp 15   Ht '5\' 1"'$  (1.549 m)   Wt 178 lb (80.7 kg)   LMP 08/24/2011   SpO2 97%   BMI  33.63 kg/m  Exam: General: Well-appearing female, resting in bed, alert, obese, very pleasant Eyes: PERRLA, EOMI ENTM: MM slightly tacky, normal posterior oropharynx Lymph nodes: Shotty anterior cervical lymphadenopathy on R. Neck: Supple, FROM Cardiovascular: RRR, S1, S2, no mrg Respiratory: CTAB, no increased WOB Gastrointestinal: Hyperactive BS, mildly TTP over LLQ, no guarding MSK: Normal tone, moves all spontaneously GU: Frank blood upon DRE. Small external hemorrhoids but no internal hemorrhoids appreciated or masses. Derm: No rashes or lesions noted on exposed skin Neuro: AOx4, no focal deficits Psych: Normal mood and affect  Labs and Imaging: CBC BMET   Recent Labs Lab 02/03/17 1959  WBC 11.9*  HGB 10.1*  HCT 31.6*  PLT 250    Recent Labs Lab 02/03/17 1959  NA 138  K 3.8  CL 109   CO2 22  BUN 18  CREATININE 0.73  GLUCOSE 110*  CALCIUM 8.5*     Dg Chest 2 View  Result Date: 01/26/2017 CLINICAL DATA:  Shortness of breath and cough for 3 days, chest pain for 1 day. EXAM: CHEST  2 VIEW COMPARISON:  08/26/2011 radiographs FINDINGS: The cardiomediastinal silhouette is unremarkable. There is no evidence of focal airspace disease, pulmonary edema, suspicious pulmonary nodule/mass, pleural effusion, or pneumothorax. No acute bony abnormalities are identified. IMPRESSION: No active cardiopulmonary disease. Electronically Signed   By: Margarette Canada M.D.   On: 01/26/2017 15:50   Rogue Bussing, MD 02/03/2017, 10:01 PM PGY-2, [place] Intern pager: [phone], text pages welcome

## 2017-02-03 NOTE — ED Triage Notes (Signed)
Per EMS pt from home 4 days ago LLQ abdomen pain, getting worse today 2pm BM had bright red blood, husband got to home pt almost passed out. Pt pale, cool diaphoretic, 18 G L a/c, 600 ml fluid, 90/62 pressure after fluid

## 2017-02-03 NOTE — ED Provider Notes (Signed)
[facility] DEPT Provider Note   CSN: [id number] Arrival date & time: 02/03/17  1901     History   Chief Complaint Chief Complaint  Patient presents with  . Near Syncope    HPI Breanna [name] is a 46 y.o. female.  The history is provided by the patient and medical records.  Loss of Consciousness   This is a new problem. The current episode started less than 1 hour ago. The problem occurs constantly. Associated symptoms include abdominal pain and dizziness. Pertinent negatives include back pain, chest pain, fever, palpitations, seizures and vomiting. She has tried nothing for the symptoms. The treatment provided no relief.    Past Medical History:  Diagnosis Date  . Anxiety   . Back pain   . Depressed   . Diverticulitis   . Hyperlipemia     Patient Active Problem List   Diagnosis Date Noted  . Hematochezia 02/03/2017    History reviewed. No pertinent surgical history.  OB History    No data available       Home Medications    Prior to Admission medications   Medication Sig Start Date End Date Taking? Authorizing Provider  azelastine (OPTIVAR) 0.05 % ophthalmic solution Place 1 drop into both eyes daily. 01/21/17  Yes [provider]  BREO ELLIPTA 200-25 MCG/INH AEPB Inhale 1 puff into the lungs daily. 01/21/17  Yes [provider]  fluticasone (FLONASE) 50 MCG/ACT nasal spray Place 1 spray into both nostrils daily. 01/08/17  Yes [provider]  HYDROcodone-acetaminophen (NORCO/VICODIN) 5-325 MG tablet Take 1 tablet by mouth every 6 (six) hours as needed for moderate pain.   Yes [provider]  ibuprofen (ADVIL,MOTRIN) 200 MG tablet Take 400-800 mg by mouth See admin instructions. Takes 400 mg to 800 mg as needed for pain   Yes [provider]  levothyroxine (SYNTHROID, LEVOTHROID) 50 MCG tablet Take 50 mcg by mouth every morning. 12/29/16  Yes [provider]  lidocaine (LIDODERM) 5 % Place 1 patch onto the  skin as needed for pain. 10/29/14  Yes [provider]  Melatonin 10 MG TABS Take 10 mg by mouth at bedtime.   Yes [provider]  montelukast (SINGULAIR) 10 MG tablet Take 10 mg by mouth daily. 01/21/17  Yes [provider]  topiramate (TOPAMAX) 50 MG tablet Take 75 mg by mouth 2 (two) times daily. 01/28/17  Yes [provider]  EPIPEN 2-PAK 0.3 MG/0.3ML SOAJ injection Inject 1 Dose into the muscle See admin instructions. For allergic reaction 01/21/17   [provider]  Multiple Vitamin (MULITIVITAMIN WITH MINERALS) TABS Take 1 tablet by mouth daily.    [provider]  omeprazole (PRILOSEC) 20 MG capsule Take 20 mg by mouth daily.    [provider]  OVER THE COUNTER MEDICATION Take 2 tablets by mouth daily. Acetaminophen 325 mg, Guaifenesis 200 mg, and Phenylephrine 5 mg    [provider]  simvastatin (ZOCOR) 40 MG tablet Take 40 mg by mouth every evening.    [provider]    Family History No family history on file.  Social History Social History  Substance Use Topics  . Smoking status: Never Smoker  . Smokeless tobacco: Never Used  . Alcohol use No     Allergies   Mold extract [trichophyton]   Review of Systems Review of Systems  Constitutional: Positive for fatigue. Negative for chills and fever.  HENT: Negative for ear pain and sore throat.   Eyes:  Negative for pain and visual disturbance.  Respiratory: Negative for cough and shortness of breath.   Cardiovascular: Positive for syncope. Negative for chest pain and palpitations.  Gastrointestinal: Positive for abdominal pain and blood in stool. Negative for vomiting.  Genitourinary: Negative for dysuria and hematuria.  Musculoskeletal: Negative for arthralgias and back pain.  Skin: Negative for color change and rash.  Neurological: Positive for dizziness and syncope. Negative for seizures.  All other systems reviewed and are  negative.    Physical Exam Updated Vital Signs BP 101/65 (BP Location: Left Arm)   Pulse 78   Temp 98.2 F (36.8 C) (Oral)   Resp 19   Ht 5\' 1"  (1.549 m)   Wt 80.7 kg (178 lb)   LMP 08/24/2011   SpO2 98%   BMI 33.63 kg/m   Physical Exam  Constitutional: She appears well-developed. She appears ill. No distress.  HENT:  Head: Normocephalic and atraumatic.  Eyes: Conjunctivae are normal.  Neck: Neck supple.  Cardiovascular: Normal rate and regular rhythm.   No murmur heard. Pulmonary/Chest: Effort normal and breath sounds normal. No respiratory distress.  Abdominal: Soft. There is no tenderness.  Musculoskeletal: She exhibits no edema.  Neurological: She is alert. No cranial nerve deficit. Coordination normal.  5/5 motor strength and intact sensation in all extremities. Finger-to-nose intact bilaterally  Skin: Skin is warm and dry.  Nursing note and vitals reviewed.    ED Treatments / Results  Labs (all labs ordered are listed, but only abnormal results are displayed) Labs Reviewed  CBC WITH DIFFERENTIAL/PLATELET - Abnormal; Notable for the following:       Result Value   WBC 11.9 (*)    RBC 3.64 (*)    Hemoglobin 10.1 (*)    HCT 31.6 (*)    Neutro Abs 8.7 (*)    All other components within normal limits  URINALYSIS, ROUTINE W REFLEX MICROSCOPIC - Abnormal; Notable for the following:    APPearance CLOUDY (*)    Hgb urine dipstick MODERATE (*)    Leukocytes, UA SMALL (*)    Bacteria, UA RARE (*)    Squamous Epithelial / LPF 6-30 (*)    All other components within normal limits  COMPREHENSIVE METABOLIC PANEL - Abnormal; Notable for the following:    Glucose, Bld 110 (*)    Calcium 8.5 (*)    Total Protein 5.9 (*)    Albumin 3.2 (*)    All other components within normal limits  CBC - Abnormal; Notable for the following:    WBC 11.0 (*)    RBC 3.77 (*)    Hemoglobin 10.2 (*)    HCT 32.4 (*)    All other components within normal limits  LIPASE, BLOOD  HIV  ANTIBODY (ROUTINE TESTING)  BASIC METABOLIC PANEL  PROTIME-INR  I-STAT BETA HCG BLOOD, ED ([facility], [facility], [facility] ONLY)  TYPE AND SCREEN  GC/CHLAMYDIA PROBE AMP ([place]) NOT AT [facility]    EKG  EKG Interpretation  Date/Time:  Thursday February 03 2017 19:23:05 EDT Ventricular Rate:  89 PR Interval:    QRS Duration: 79 QT Interval:  341 QTC Calculation: 415 R Axis:   21 Text Interpretation:  Sinus rhythm Low voltage, precordial leads Borderline T abnormalities, inferior leads No significant change since last tracing Confirmed by Wandra Arthurs [id number]) on 02/03/2017 7:50:01 PM       Radiology No results found.  Procedures Procedures (including critical care time)  Medications Ordered in ED Medications  pantoprazole (PROTONIX) 80 mg  in sodium chloride 0.9 % 250 mL (0.32 mg/mL) infusion (8 mg/hr Intravenous New Bag/Given 02/03/17 2149)  fluticasone (FLONASE) 50 MCG/ACT nasal spray 1 spray (not administered)  levothyroxine (SYNTHROID, LEVOTHROID) tablet 50 mcg (not administered)  lidocaine (LIDODERM) 5 % 1 patch (not administered)  montelukast (SINGULAIR) tablet 10 mg (not administered)  topiramate (TOPAMAX) tablet 75 mg (not administered)  sodium chloride flush (NS) 0.9 % injection 3 mL (3 mLs Intravenous Given 02/04/17 0037)  traMADol (ULTRAM) tablet 50 mg (not administered)  ondansetron (ZOFRAN) tablet 4 mg (not administered)    Or  ondansetron (ZOFRAN) injection 4 mg (not administered)  lidocaine (XYLOCAINE) 2 % viscous mouth solution 15 mL (not administered)  0.9 %  sodium chloride infusion ( Intravenous New Bag/Given 02/04/17 0036)  sodium chloride 0.9 % bolus 1,000 mL (0 mLs Intravenous Stopped 02/03/17 2150)  pantoprazole (PROTONIX) 80 mg in sodium chloride 0.9 % 100 mL IVPB (0 mg Intravenous Stopped 02/03/17 2209)  sodium chloride 0.9 % bolus 1,000 mL (0 mLs Intravenous Stopped 02/03/17 2300)     Initial Impression / Assessment and Plan / ED Course  I have reviewed the triage vital  signs and the nursing notes.  Pertinent labs & imaging results that were available during my care of the patient were reviewed by me and considered in my medical decision making (see chart for details).     46 year old female history of diverticulitis who presents with BRBPR, syncope, and hypotension.  She reports acute onset of symptoms this afternoon.  She had 3 episodes of large-volume BRBPR.  Denies previous episode of this pain. Shortly afterwards, she developed generalized malaise and lightheadedness. Her husband was driving her to hospital when en route she syncopized. EMS was contacted and noted BP of 90s/50s that responsed with IVF. She states she had a completely normal colonoscopy within past 6 months. She has been taking ibuprofen daily for the past 3 weeks for a lingering URI and sore throat. Currently with LLQ abdominal pain. No fevers or emesis.  Afebrile, BP 101/65 on arrival. No focal neuro deficits.  Lungs clear to auscultation bilaterally.  Abdomen minimally tender and soft throughout.  CBC showing Hgb 10.1 (last value of 12.2 noted 5 years ago).   While in ED, patient produced a large melanotic sample of stool highly suspcious for bleeding. Systolic BP dropped to 86V-78I after IVF finished. Additional IVF given with response of BP. Type and screen sent.  Pt admitted to [facility] (step-down unit) for further management and evaluation. Pt stable at time of transfer  Pt care d/w Dr. Darl Householder  Final Clinical Impressions(s) / ED Diagnoses   Final diagnoses:  Lower GI bleed  Near syncope    New Prescriptions Current Discharge Medication List       Payton Emerald, MD 02/04/17 0114    Drenda Freeze, MD 02/04/17 1924

## 2017-02-03 NOTE — ED Notes (Signed)
Attempted report x1. 

## 2017-02-04 ENCOUNTER — Encounter (HOSPITAL_COMMUNITY): Payer: Self-pay

## 2017-02-04 DIAGNOSIS — G8929 Other chronic pain: Secondary | ICD-10-CM

## 2017-02-04 DIAGNOSIS — R55 Syncope and collapse: Secondary | ICD-10-CM

## 2017-02-04 DIAGNOSIS — R109 Unspecified abdominal pain: Secondary | ICD-10-CM

## 2017-02-04 DIAGNOSIS — R1032 Left lower quadrant pain: Secondary | ICD-10-CM

## 2017-02-04 DIAGNOSIS — M545 Low back pain: Secondary | ICD-10-CM

## 2017-02-04 DIAGNOSIS — K922 Gastrointestinal hemorrhage, unspecified: Secondary | ICD-10-CM

## 2017-02-04 LAB — HIV ANTIBODY (ROUTINE TESTING W REFLEX): HIV Screen 4th Generation wRfx: NONREACTIVE

## 2017-02-04 LAB — CBC
HCT: 32.4 % — ABNORMAL LOW (ref 36.0–46.0)
HCT: 31 % — ABNORMAL LOW (ref 36.0–46.0)
HEMATOCRIT: 31.6 % — AB (ref 36.0–46.0)
HEMOGLOBIN: 9.6 g/dL — AB (ref 12.0–15.0)
Hemoglobin: 10.2 g/dL — ABNORMAL LOW (ref 12.0–15.0)
Hemoglobin: 9.6 g/dL — ABNORMAL LOW (ref 12.0–15.0)
MCH: 26.8 pg (ref 26.0–34.0)
MCH: 26.4 pg (ref 26.0–34.0)
MCH: 27.1 pg (ref 26.0–34.0)
MCHC: 31.5 g/dL (ref 30.0–36.0)
MCHC: 31 g/dL (ref 30.0–36.0)
MCHC: 30.4 g/dL (ref 30.0–36.0)
MCV: 86.6 fL (ref 78.0–100.0)
MCV: 86.8 fL (ref 78.0–100.0)
MCV: 85.9 fL (ref 78.0–100.0)
PLATELETS: 282 10*3/uL (ref 150–400)
PLATELETS: 280 10*3/uL (ref 150–400)
Platelets: 292 10*3/uL (ref 150–400)
RBC: 3.58 MIL/uL — AB (ref 3.87–5.11)
RBC: 3.64 MIL/uL — ABNORMAL LOW (ref 3.87–5.11)
RBC: 3.77 MIL/uL — AB (ref 3.87–5.11)
RDW: 13.4 % (ref 11.5–15.5)
RDW: 13.4 % (ref 11.5–15.5)
RDW: 13.5 % (ref 11.5–15.5)
WBC: 11 10*3/uL — ABNORMAL HIGH (ref 4.0–10.5)
WBC: 8.4 10*3/uL (ref 4.0–10.5)
WBC: 7.8 10*3/uL (ref 4.0–10.5)

## 2017-02-04 LAB — MRSA PCR SCREENING: MRSA by PCR: NEGATIVE

## 2017-02-04 LAB — BASIC METABOLIC PANEL
Anion gap: 8 (ref 5–15)
BUN: 14 mg/dL (ref 6–20)
CO2: 21 mmol/L — ABNORMAL LOW (ref 22–32)
Calcium: 8.3 mg/dL — ABNORMAL LOW (ref 8.9–10.3)
Chloride: 112 mmol/L — ABNORMAL HIGH (ref 101–111)
Creatinine, Ser: 0.65 mg/dL (ref 0.44–1.00)
GFR calc Af Amer: >60 mL/min (ref >60)
GFR calc non Af Amer: >60 mL/min (ref >60)
GLUCOSE: 115 mg/dL — AB (ref 65–99)
POTASSIUM: 3.6 mmol/L (ref 3.5–5.1)
Sodium: 141 mmol/L (ref 135–145)

## 2017-02-04 LAB — PROTIME-INR
INR: 0.99
Prothrombin Time: 13.1 seconds (ref 11.4–15.2)

## 2017-02-04 MED ORDER — ACETAMINOPHEN 325 MG PO TABS
650.0000 mg | ORAL_TABLET | Freq: Once | ORAL | Status: AC
  Administered 2017-02-04: 650 mg via ORAL
  Filled 2017-02-04: qty 2

## 2017-02-04 MED ORDER — PHENOL 1.4 % MT LIQD
1.0000 | OROMUCOSAL | Status: DC | PRN
  Administered 2017-02-04: 1 via OROMUCOSAL
  Filled 2017-02-04: qty 177

## 2017-02-04 NOTE — Consult Note (Signed)
Reason for Consult: GI bleeding Referring Physician: Hospital team  Breanna [name] is an 46 y.o. female.  HPI: Patient seen in the past by my partner Dr. B but currently followed by Dr. Dorisann Frames in [place] who has had diverticulitis in the past and had a colonoscopy last year which did show some diverticuli and she has been on an increased dose of nonsteroidals and does take AcipHex for her reflux which has not been a problem lately but did have some increased dark blood yesterday and one liquid bloody bowel movement today which I looked at and it looks like older blood to me and she has some minimal left lower quadrant discomfort but has not had any GI bleeding before and has been taking the nonsteroidals for sore throat and a cold and currently she has a headache which she gets and her case discussed with her son and the primary team and her hospital computer chart and our office computer chart reviewed  Past Medical History:  Diagnosis Date  . Anxiety   . Back pain   . Depressed   . Diverticulitis   . Hyperlipemia     History reviewed. No pertinent surgical history.  History reviewed. No pertinent family history.  Social History:  reports that she has never smoked. She has never used smokeless tobacco. She reports that she does not drink alcohol or use drugs.  Allergies:  Allergies  Allergen Reactions  . Mold Extract [Trichophyton] Other (See Comments)    Rhinitis     Medications: I have reviewed the patient's current medications.  Results for orders placed or performed during the hospital encounter of 02/03/17 (from the past 48 hour(s))  Urinalysis, Routine w reflex microscopic     Status: Abnormal   Collection Time: 02/03/17  7:25 PM  Result Value Ref Range   Color, Urine YELLOW YELLOW   APPearance CLOUDY (A) CLEAR   Specific Gravity, Urine 1.026 1.005 - 1.030   pH 5.0 5.0 - 8.0   Glucose, UA NEGATIVE NEGATIVE mg/dL   Hgb urine dipstick MODERATE (A) NEGATIVE   Bilirubin  Urine NEGATIVE NEGATIVE   Ketones, ur NEGATIVE NEGATIVE mg/dL   Protein, ur NEGATIVE NEGATIVE mg/dL   Nitrite NEGATIVE NEGATIVE   Leukocytes, UA SMALL (A) NEGATIVE   RBC / HPF 0-5 0 - 5 RBC/hpf   WBC, UA 6-30 0 - 5 WBC/hpf   Bacteria, UA RARE (A) NONE SEEN   Squamous Epithelial / LPF 6-30 (A) NONE SEEN   Mucous PRESENT    Hyaline Casts, UA PRESENT   Type and screen [facility]     Status: None   Collection Time: 02/03/17  7:40 PM  Result Value Ref Range   ABO/RH(D) O POS    Antibody Screen NEG    Sample Expiration 02/06/2017   I-Stat Beta hCG blood, ED ([facility], [facility], [facility] only)     Status: None   Collection Time: 02/03/17  7:47 PM  Result Value Ref Range   I-stat hCG, quantitative <5.0 <5 mIU/mL   Comment 3            Comment:   GEST. AGE      CONC.  (mIU/mL)   <=1 WEEK        5 - 50     2 WEEKS       50 - 500     3 WEEKS       100 - 10,000     4 WEEKS  1,000 - 30,000        FEMALE AND NON-PREGNANT FEMALE:     LESS THAN 5 mIU/mL   CBC with Differential     Status: Abnormal   Collection Time: 02/03/17  7:59 PM  Result Value Ref Range   WBC 11.9 (H) 4.0 - 10.5 K/uL   RBC 3.64 (L) 3.87 - 5.11 MIL/uL   Hemoglobin 10.1 (L) 12.0 - 15.0 g/dL   HCT 31.6 (L) 36.0 - 46.0 %   MCV 86.8 78.0 - 100.0 fL   MCH 27.7 26.0 - 34.0 pg   MCHC 32.0 30.0 - 36.0 g/dL   RDW 13.6 11.5 - 15.5 %   Platelets 250 150 - 400 K/uL   Neutrophils Relative % 73 %   Neutro Abs 8.7 (H) 1.7 - 7.7 K/uL   Lymphocytes Relative 21 %   Lymphs Abs 2.4 0.7 - 4.0 K/uL   Monocytes Relative 4 %   Monocytes Absolute 0.5 0.1 - 1.0 K/uL   Eosinophils Relative 2 %   Eosinophils Absolute 0.2 0.0 - 0.7 K/uL   Basophils Relative 0 %   Basophils Absolute 0.1 0.0 - 0.1 K/uL  Comprehensive metabolic panel     Status: Abnormal   Collection Time: 02/03/17  7:59 PM  Result Value Ref Range   Sodium 138 135 - 145 mmol/L   Potassium 3.8 3.5 - 5.1 mmol/L   Chloride 109 101 - 111 mmol/L   CO2 22 22 - 32 mmol/L    Glucose, Bld 110 (H) 65 - 99 mg/dL   BUN 18 6 - 20 mg/dL   Creatinine, Ser 0.73 0.44 - 1.00 mg/dL   Calcium 8.5 (L) 8.9 - 10.3 mg/dL   Total Protein 5.9 (L) 6.5 - 8.1 g/dL   Albumin 3.2 (L) 3.5 - 5.0 g/dL   AST 18 15 - 41 U/L   ALT 17 14 - 54 U/L   Alkaline Phosphatase 55 38 - 126 U/L   Total Bilirubin 0.4 0.3 - 1.2 mg/dL   GFR calc non Af Amer >60 >60 mL/min   GFR calc Af Amer >60 >60 mL/min    Comment: (NOTE) The eGFR has been calculated using the CKD EPI equation. This calculation has not been validated in all clinical situations. eGFR's persistently <60 mL/min signify possible Chronic Kidney Disease.    Anion gap 7 5 - 15  Lipase, blood     Status: None   Collection Time: 02/03/17  7:59 PM  Result Value Ref Range   Lipase 28 11 - 51 U/L  CBC     Status: Abnormal   Collection Time: 02/04/17 12:31 AM  Result Value Ref Range   WBC 11.0 (H) 4.0 - 10.5 K/uL   RBC 3.77 (L) 3.87 - 5.11 MIL/uL   Hemoglobin 10.2 (L) 12.0 - 15.0 g/dL   HCT 32.4 (L) 36.0 - 46.0 %   MCV 85.9 78.0 - 100.0 fL   MCH 27.1 26.0 - 34.0 pg   MCHC 31.5 30.0 - 36.0 g/dL   RDW 13.4 11.5 - 15.5 %   Platelets 280 150 - 400 K/uL  Basic metabolic panel     Status: Abnormal   Collection Time: 02/04/17 12:31 AM  Result Value Ref Range   Sodium 141 135 - 145 mmol/L   Potassium 3.6 3.5 - 5.1 mmol/L   Chloride 112 (H) 101 - 111 mmol/L   CO2 21 (L) 22 - 32 mmol/L   Glucose, Bld 115 (H) 65 - 99 mg/dL  BUN 14 6 - 20 mg/dL   Creatinine, Ser 0.65 0.44 - 1.00 mg/dL   Calcium 8.3 (L) 8.9 - 10.3 mg/dL   GFR calc non Af Amer >60 >60 mL/min   GFR calc Af Amer >60 >60 mL/min    Comment: (NOTE) The eGFR has been calculated using the CKD EPI equation. This calculation has not been validated in all clinical situations. eGFR's persistently <60 mL/min signify possible Chronic Kidney Disease.    Anion gap 8 5 - 15  Protime-INR     Status: None   Collection Time: 02/04/17 12:31 AM  Result Value Ref Range   Prothrombin  Time 13.1 11.4 - 15.2 seconds   INR 0.99   MRSA PCR Screening     Status: None   Collection Time: 02/04/17  1:19 AM  Result Value Ref Range   MRSA by PCR NEGATIVE NEGATIVE    Comment:        The GeneXpert MRSA Assay (FDA approved for NASAL specimens only), is one component of a comprehensive MRSA colonization surveillance program. It is not intended to diagnose MRSA infection nor to guide or monitor treatment for MRSA infections.   CBC     Status: Abnormal   Collection Time: 02/04/17  8:25 AM  Result Value Ref Range   WBC 8.4 4.0 - 10.5 K/uL   RBC 3.58 (L) 3.87 - 5.11 MIL/uL   Hemoglobin 9.6 (L) 12.0 - 15.0 g/dL   HCT 31.0 (L) 36.0 - 46.0 %   MCV 86.6 78.0 - 100.0 fL   MCH 26.8 26.0 - 34.0 pg   MCHC 31.0 30.0 - 36.0 g/dL   RDW 13.4 11.5 - 15.5 %   Platelets 282 150 - 400 K/uL    No results found.  ROSNegative except above  Blood pressure 105/69, pulse 84, temperature 98.3 F (36.8 C), temperature source Oral, resp. rate 12, height 5' 1" (1.549 m), weight 80.7 kg (178 lb), last menstrual period 08/24/2011, SpO2 100 %. Physical ExamVital signs stable afebrile no acute distress exam pertinent for her abdomen being soft nontender labs reviewed hemoglobin fairly stable BUN and creatinine okay  Assessment/Plan: Probable diverticular bleeding currently stable Plan: We'll allow clear liquids if signs of bleeding increases would proceed with nuclear bleeding scan and if positive consider IR consult otherwise will last my partner Dr. Paulita Fujita to check on tomorrow but hopefully her diet can be advanced if no further bleeding and I cautioned her about aspirin and nonsteroidals compared to Tylenol for pain  MAGOD,MARC E 02/04/2017, 9:52 AM

## 2017-02-04 NOTE — Discharge Summary (Signed)
[facility] Discharge Summary  Patient name: Breanna [name] Medical record number: [id number] Date of birth: 08-13-1971 Age: 46 y.o. Gender: female Date of Admission: 02/03/2017  Date of Discharge: 02/09/17 Admitting Physician: Lind Covert, MD  Primary Care Provider: Blair Heys, PA-C Consultants: Gastroenterology  Indication for Hospitalization: GI bleed  Discharge Diagnoses/Problem List:  Patient Active Problem List   Diagnosis Date Noted  . Cystitis   . Upper GI bleed   . Gastroesophageal reflux disease   . Near syncope   . Abdominal pain   . Chronic bilateral low back pain   . GI bleed   . Hematochezia 02/03/2017    Disposition: Home  Discharge Condition: Stable, improved  Discharge Exam: Please see progress note from day of discharge  Brief Hospital Course:  Breanna A Danielsis a 46 y.o.female [place] significant for allergy-induced asthma, chronic low back pain s/p L5-S1 fusion in 2008, diverticulitis, hypothyroidism,endometriosis, GERD, and prolactinoma who presented with several episodes of BRBPR. Patient had several episodes of mixed dark and BRBPR associated hypotension and episode of syncope. On admission her BP was 90/62 which improved with IV hydration and remained stable. Gastroenterology was consulted found that her colonoscopy last year showed some diverticuli and she has been on an increased dose of nonsteroidals and steroids for sore throat/bronchitis that was now resolved. Tagged RBC scan showed a upper GI bleed so an EGD was performed that did not have any source of bleeding. Her hgb was monitored and was stable around ~8.5 and her GI bleeding slowed down and resolved.   During her hospital stay she had some mild dysuria and UA was c/w acute uncomplicated cystitis so she was started on a 5 day course of keflex. The day of discharge she had some L forearm numbness/tingling and a palpable cord so a LUE doppler was performed  that was negative for DVT but showed a superficial cephalic vein thrombus. Given patient's acute GI bleed, ASA and anticoagulation was not started and advised patient to use warm compresses and follow up with PCP for monitoring.   Issues for Follow Up:  1. Please follow up on Hgb in the setting of GI bleed, is stable at 8.7 on discharge. 2. Patient should follow up with [facility] in 2 weeks 3. Is on a 5 day course of keflex for acute uncomplicated UTI, last dose on 02/12/17 4. Has superficial cephalic vein thrombus in L arm, no ASA or anticoagluation d/t GI bleed. Please monitor.  Significant Procedures: EGD (normal esophagus, few gastric polyps, normal duodenum)  Significant Labs and Imaging:   Recent Labs Lab 02/07/17 1759 02/08/17 0815 02/09/17 0332  WBC 9.2 9.9 10.5  HGB 8.7* 8.6* 8.7*  HCT 27.8* 27.4* 27.5*  PLT 327 343 245    Recent Labs Lab 02/03/17 1959  02/05/17 0244 02/06/17 0232 02/07/17 0657 02/08/17 0331 02/09/17 0332  NA 138  < > 144 141 140 140 139  K 3.8  < > 3.2* 3.1* 3.5 3.4* 4.0  CL 109  < > 112* 111 108 110 111  CO2 22  < > 23 25 26 24  20*  GLUCOSE 110*  < > 93 103* 99 108* 120*  BUN 18  < > 7 7 6  5* 10  CREATININE 0.73  < > 0.69 0.76 0.72 0.84 0.76  CALCIUM 8.5*  < > 8.3* 8.3* 8.7* 8.7* 8.9  ALKPHOS 55  --   --   --   --   --   --  AST 18  --   --   --   --   --   --   ALT 17  --   --   --   --   --   --   ALBUMIN 3.2*  --   --   --   --   --   --   < > = values in this interval not displayed.    Results/Tests Pending at Time of Discharge: Gastric polyps pathology  Discharge Medications:  Allergies as of 02/09/2017      Reactions   Mold Extract [trichophyton] Other (See Comments)   Rhinitis       Medication List    STOP taking these medications   omeprazole 20 MG capsule Commonly known as:  PRILOSEC   OVER THE COUNTER MEDICATION     TAKE these medications   azelastine 0.05 % ophthalmic solution Commonly known as:  OPTIVAR Place 1  drop into both eyes daily.   BREO ELLIPTA 200-25 MCG/INH Aepb Generic drug:  fluticasone furoate-vilanterol Inhale 1 puff into the lungs daily.   cephALEXin 500 MG capsule Commonly known as:  KEFLEX Take 1 capsule (500 mg total) by mouth every 12 (twelve) hours.   EPIPEN 2-PAK 0.3 mg/0.3 mL Soaj injection Generic drug:  EPINEPHrine Inject 1 Dose into the muscle See admin instructions. For allergic reaction   fluticasone 50 MCG/ACT nasal spray Commonly known as:  FLONASE Place 1 spray into both nostrils daily.   HYDROcodone-acetaminophen 5-325 MG tablet Commonly known as:  NORCO/VICODIN Take 1 tablet by mouth every 6 (six) hours as needed for moderate pain.   ibuprofen 200 MG tablet Commonly known as:  ADVIL,MOTRIN Take 400-800 mg by mouth See admin instructions. Takes 400 mg to 800 mg as needed for pain   levothyroxine 50 MCG tablet Commonly known as:  SYNTHROID, LEVOTHROID Take 50 mcg by mouth every morning.   lidocaine 5 % Commonly known as:  LIDODERM Place 1 patch onto the skin as needed for pain.   loratadine 10 MG tablet Commonly known as:  CLARITIN Take 1 tablet (10 mg total) by mouth daily. Start taking on:  02/10/2017   Melatonin 10 MG Tabs Take 10 mg by mouth at bedtime.   montelukast 10 MG tablet Commonly known as:  SINGULAIR Take 10 mg by mouth daily.   multivitamin with minerals Tabs tablet Take 1 tablet by mouth daily.   pantoprazole 40 MG tablet Commonly known as:  PROTONIX Take 1 tablet (40 mg total) by mouth daily. Start taking on:  02/10/2017   simvastatin 40 MG tablet Commonly known as:  ZOCOR Take 40 mg by mouth every evening.   topiramate 50 MG tablet Commonly known as:  TOPAMAX Take 75 mg by mouth 2 (two) times daily.       Discharge Instructions: Please refer to Patient Instructions section of EMR for full details.  Patient was counseled important signs and symptoms that should prompt return to medical care, changes in medications,  dietary instructions, activity restrictions, and follow up appointments.   Follow-Up Appointments: Follow-up Information    Long, Caryl Pina, [place]. Call in 1 week(s).   Specialty:  Physician Assistant Why:  Please make a hospital follow up appointment to be seen within 1 week of leaving the hospital. Contact information: [address] [place] [address] [phone]        Gastroenterology, [facility] Follow up.   Why:  Someone will call you to make a hospital follow  up appointment 2 weeks after leaving the hospital. If you have not heard from them in 1 week then call just to make sure you have an appointment. Contact information: [address] [place] [address] [phone]           Bufford Lope, DO 02/09/2017, 1:52 PM PGY-1, [place]

## 2017-02-04 NOTE — ED Notes (Signed)
Dr Rene Kocher placed order for lidocaine mouth solution for when he comes back to check for the area of the bleed in pt, lidocaine not given at present and sent to floor to do procedure there

## 2017-02-04 NOTE — Plan of Care (Signed)
Problem: Activity: Goal: Risk for activity intolerance will decrease Outcome: Progressing Encouraged ambulation to bedside commode and use of SCD's to prevent embolism

## 2017-02-04 NOTE — Progress Notes (Signed)
Family Medicine Teaching Service Daily Progress Note Intern Pager: [phone]  Patient name: Breanna [name] Medical record number: [id number] Date of birth: [birth year] Age: 46 y.o. Gender: female  Primary Care Provider: Blair Heys, PA-C Consultants: Gastroenterology Code Status: Full   Pt Overview and Major Events to Date:  6/21 admitted for hematochezia  Assessment and Plan: Breanna [name] is a 46 y.o. female presenting with several episodes of BRBPR since this afternoon. PMH is significant for allergy-induced asthma, chronic low back pain s/p L5-S1 fusion in 2008, diverticulitis, hypothyroidism, endometriosis, GERD, and prolactinoma.   GI bleed: 6 episodes of mixed dark and BRBPR associated hypotension and episode of syncope. BP has responded well to IVFs, improved at 105/69 from 98/59. Frequency and amount of blood decreasing. Recent exposures to steroids and NSAIDs.  Hgb decreased to 9.6 from 10.1 on admission. Per patient last Colonoscopy 2017 normal.  - Monitor on Telemetry - s/p 2L fluid boluses - Continue IVFs at 125 mL/hr - Continue IV protonix - Gastroenterology consulted, appreciate recommendations - monitor CBC - NPO in case of GI procedure  Abdominal pain, improved: H/o diverticulitis. Current discomfort feels similar to past episodes. Completed course of augmentin 4 days ago for sinusitis. Has been tolerating diet recently. Mild leukocytosis of 11.9 on admission improved at 8.4 - Continue to monitor - Tramadol 50 mg q6h prn - Trend WBC  GERD: Has been on aciphex - IV protonix in setting of acute GI bleed  Allergies: Recently started on breo inhaler but requests not to continue while hospitalized, as thinks it has contributed to recent sore throat.  - Continue home singulair - Continue home flonase  Chronic low back pain: Follows with a pain management clinic. Has lidocaine patches and hydrocodone-acetaminophen as home medications, though last dose of the  latter was 2 weeks ago. - Ordered home lidoderm patch  Persistent cough: Recent CXR performed for persistent cough and was negative. In setting of nasal congestion suspect post-nasal drip.  - Continue home flonase  Hypothyroidism: - Continue home synthroid 50 mcg  Prolactinoma: Now asymptomatic. Says she recently stopped treatment for this. Last MRI 01/03/17 showed no pituitary mass.   FEN/GI: NPO, IV protonix  Prophylaxis: SCDs  Disposition: pending GI recommendations  Subjective:  Continues to have bloody loose stools, which are spacing out, had once last night than none until one this morning. Mixed dark and bright red blood. Only slightly lightheaded, improved from admission. Has mild headache. Styates LLQ abdomen pain is mildly improved from admission as well.   Objective: Temp:  [98.2 F (36.8 C)-98.4 F (36.9 C)] 98.3 F (36.8 C) (06/22 0829) Pulse Rate:  [77-105] 84 (06/22 0829) Resp:  [11-23] 12 (06/22 0829) BP: (93-123)/(59-75) 105/69 (06/22 0829) SpO2:  [97 %-100 %] 100 % (06/22 0829) Weight:  [178 lb (80.7 kg)] 178 lb (80.7 kg) (06/21 1919) Physical Exam: General: Laying in bed, in NAD Cardiovascular: RRR, no murmurs Respiratory: CTAB, normal effort on room air Abdomen: mildly TTP over LLQ, no guarding or rebound. soft, nondistended, + bs Extremities: no LE edema  Laboratory:  Recent Labs Lab 02/03/17 1959 02/04/17 0031 02/04/17 0825  WBC 11.9* 11.0* 8.4  HGB 10.1* 10.2* 9.6*  HCT 31.6* 32.4* 31.0*  PLT 250 280 282    Recent Labs Lab 02/03/17 1959 02/04/17 0031  NA 138 141  K 3.8 3.6  CL 109 112*  CO2 22 21*  BUN 18 14  CREATININE 0.73 0.65  CALCIUM 8.5* 8.3*  PROT 5.9*  --  BILITOT 0.4  --   ALKPHOS 55  --   ALT 17  --   AST 18  --   GLUCOSE 110* 115*    Imaging/Diagnostic Tests: No results found.   Bufford Lope, DO 02/04/2017, 7:09 AM PGY-1, [place] Intern pager: [phone], text pages welcome

## 2017-02-05 LAB — CBC
HCT: 28.2 % — ABNORMAL LOW (ref 36.0–46.0)
HEMATOCRIT: 28.3 % — AB (ref 36.0–46.0)
HEMOGLOBIN: 9.1 g/dL — AB (ref 12.0–15.0)
Hemoglobin: 8.8 g/dL — ABNORMAL LOW (ref 12.0–15.0)
MCH: 27 pg (ref 26.0–34.0)
MCH: 27.7 pg (ref 26.0–34.0)
MCHC: 31.2 g/dL (ref 30.0–36.0)
MCHC: 32.2 g/dL (ref 30.0–36.0)
MCV: 86.5 fL (ref 78.0–100.0)
MCV: 86 fL (ref 78.0–100.0)
PLATELETS: 259 10*3/uL (ref 150–400)
Platelets: 278 10*3/uL (ref 150–400)
RBC: 3.26 MIL/uL — ABNORMAL LOW (ref 3.87–5.11)
RBC: 3.29 MIL/uL — ABNORMAL LOW (ref 3.87–5.11)
RDW: 13.7 % (ref 11.5–15.5)
RDW: 13.4 % (ref 11.5–15.5)
WBC: 7.4 10*3/uL (ref 4.0–10.5)
WBC: 7 10*3/uL (ref 4.0–10.5)

## 2017-02-05 LAB — BASIC METABOLIC PANEL
Anion gap: 9 (ref 5–15)
BUN: 7 mg/dL (ref 6–20)
CALCIUM: 8.3 mg/dL — AB (ref 8.9–10.3)
CO2: 23 mmol/L (ref 22–32)
Chloride: 112 mmol/L — ABNORMAL HIGH (ref 101–111)
Creatinine, Ser: 0.69 mg/dL (ref 0.44–1.00)
GFR calc Af Amer: >60 mL/min (ref >60)
GLUCOSE: 93 mg/dL (ref 65–99)
POTASSIUM: 3.2 mmol/L — AB (ref 3.5–5.1)
SODIUM: 144 mmol/L (ref 135–145)

## 2017-02-05 MED ORDER — POTASSIUM CHLORIDE CRYS ER 20 MEQ PO TBCR
40.0000 meq | EXTENDED_RELEASE_TABLET | Freq: Once | ORAL | Status: AC
  Administered 2017-02-05: 40 meq via ORAL

## 2017-02-05 MED ORDER — LORATADINE 10 MG PO TABS
10.0000 mg | ORAL_TABLET | Freq: Every day | ORAL | Status: DC
  Administered 2017-02-05 – 2017-02-09 (×5): 10 mg via ORAL
  Filled 2017-02-05 (×6): qty 1

## 2017-02-05 NOTE — Progress Notes (Signed)
Family Medicine Teaching Service Daily Progress Note Intern Pager: [phone]  Patient name: Breanna [name] Medical record number: [id number] Date of birth: 17-Dec-1970 Age: 46 y.o. Gender: female  Primary Care Provider: Blair Heys, PA-C Consultants: Gastroenterology Code Status: Full   Pt Overview and Major Events to Date:  6/21 admitted for hematochezia  Assessment and Plan: Breanna [name] is a 46 y.o. female presenting with several episodes of BRBPR since this afternoon. PMH is significant for allergy-induced asthma, chronic low back pain s/p L5-S1 fusion in 2008, diverticulitis, hypothyroidism, endometriosis, GERD, and prolactinoma.   GI bleed likely diverticular bleed: mixed dark and BRBPR, another bloody BM this morning. Hgb decreased from 9.6 to 8.8. Last Colonoscopy 2017 with some diverticuli. - Monitor on Telemetry - Continue IV protonix - Clear liquid diet - Gastroenterology following, If recurrent overt bleeding, consider tagged RBC study; if recurrent mild bleeding with downtrend in Hgb, consider colonoscopy +/- EGD. - monitor CBC  Abdominal pain, improved: H/o diverticulitis. Current discomfort feels similar to past episodes but WBC 7.4 WNL. - Continue to monitor - Tramadol 50 mg q6h prn  GERD: Has been on aciphex - IV protonix in setting of acute GI bleed  Allergies: Recently started on breo inhaler but requests not to continue while hospitalized, as thinks it has contributed to recent sore throat.  - Continue home singulair and flonase  Chronic low back pain: Follows with a pain management clinic. Has lidocaine patches and norco as home medications, though last dose of the latter was 2 weeks ago. - Continue home lidoderm patch  Persistent cough: Recent CXR performed for persistent cough and was negative. In setting of nasal congestion suspect post-nasal drip.  - Continue home flonase and prn chloraseptic spray  Hypothyroidism: - Continue home synthroid 50  mcg  Prolactinoma: Now asymptomatic. Says she recently stopped treatment for this. Last MRI 01/03/17 showed no pituitary mass.   FEN/GI: clear liquid diet, IV protonix  Prophylaxis: SCDs  Disposition: pending GI recommendations  Subjective:  Did well overnight, was only having small dark blood clots as BMs but had large mixed dark/bright red loose BM this morning. Her last large bloody BM prior to this morning was yesterday morning.  Minimally lightheaded with HA, which is improving. Had LLQ abdomen discomfort "prickles" and mild nausea with bloody BM this am. But tolerating liquids well.  Objective: Temp:  [97.9 F (36.6 C)-98.5 F (36.9 C)] 98.1 F (36.7 C) (06/23 0827) Pulse Rate:  [71-95] 78 (06/23 0827) Resp:  [12-22] 13 (06/23 0827) BP: (98-114)/(55-74) 107/72 (06/23 0827) SpO2:  [99 %-100 %] 100 % (06/23 0827) Physical Exam: General: Sitting up in chair, well appearing, in NAD Cardiovascular: RRR, no murmurs Respiratory: CTAB, normal effort on room air Abdomen: mildly TTP over LLQ, no guarding or rebound. soft, nondistended, + bs Extremities: no LE edema Skin: warm and dry. No pallor  Laboratory:  Recent Labs Lab 02/04/17 0825 02/04/17 1806 02/05/17 0244  WBC 8.4 7.8 7.4  HGB 9.6* 9.6* 8.8*  HCT 31.0* 31.6* 28.2*  PLT 282 292 259    Recent Labs Lab 02/03/17 1959 02/04/17 0031 02/05/17 0244  NA 138 141 144  K 3.8 3.6 3.2*  CL 109 112* 112*  CO2 22 21* 23  BUN 18 14 7   CREATININE 0.73 0.65 0.69  CALCIUM 8.5* 8.3* 8.3*  PROT 5.9*  --   --   BILITOT 0.4  --   --   ALKPHOS 55  --   --   ALT  17  --   --   AST 18  --   --   GLUCOSE 110* 115* 93    Imaging/Diagnostic Tests: No results found.   Breanna Lope, DO 02/05/2017, 10:16 AM PGY-1, [place] Intern pager: [phone], text pages welcome

## 2017-02-05 NOTE — Progress Notes (Signed)
Subjective: Mild abdominal pain. Some mild maroon stools last night.  Objective: Vital signs in last 24 hours: Temp:  [97.9 F (36.6 C)-98.5 F (36.9 C)] 98.1 F (36.7 C) (06/23 0827) Pulse Rate:  [71-95] 78 (06/23 0827) Resp:  [12-22] 13 (06/23 0827) BP: (98-114)/(55-74) 107/72 (06/23 0827) SpO2:  [99 %-100 %] 100 % (06/23 0827) Weight change:  Last BM Date: 02/04/17  PE: GEN:  NAD ABD:  Protuberant, mild generalized discomfort without peritonitis  Lab Results: CBC    Component Value Date/Time   WBC 7.4 02/05/2017 0244   RBC 3.26 (L) 02/05/2017 0244   HGB 8.8 (L) 02/05/2017 0244   HCT 28.2 (L) 02/05/2017 0244   PLT 259 02/05/2017 0244   MCV 86.5 02/05/2017 0244   MCH 27.0 02/05/2017 0244   MCHC 31.2 02/05/2017 0244   RDW 13.7 02/05/2017 0244   LYMPHSABS 2.4 02/03/2017 1959   MONOABS 0.5 02/03/2017 1959   EOSABS 0.2 02/03/2017 1959   BASOSABS 0.1 02/03/2017 1959   CMP     Component Value Date/Time   NA 144 02/05/2017 0244   K 3.2 (L) 02/05/2017 0244   CL 112 (H) 02/05/2017 0244   CO2 23 02/05/2017 0244   GLUCOSE 93 02/05/2017 0244   BUN 7 02/05/2017 0244   CREATININE 0.69 02/05/2017 0244   CALCIUM 8.3 (L) 02/05/2017 0244   PROT 5.9 (L) 02/03/2017 1959   ALBUMIN 3.2 (L) 02/03/2017 1959   AST 18 02/03/2017 1959   ALT 17 02/03/2017 1959   ALKPHOS 55 02/03/2017 1959   BILITOT 0.4 02/03/2017 1959   GFRNONAA >60 02/05/2017 0244   GFRAA >60 02/05/2017 0244   Assessment:  1.  Abdominal pain, mild, doubt diverticulitis.   Possibly diverticular spasm from GI bleeding. 2.  GI bleeding, suspect diverticular, maroon with clots, normal BUN 3.  Acute blood loss anemia.  Plan:  1.  Continue clear liquids. 2.  If recurrent overt bleeding, consider tagged RBC study; if recurrent mild bleeding with downtrend in Hgb, consider colonoscopy +/- EGD. 3.  [facility] will follow.   Breanna [name] 02/05/2017, 9:46 AM

## 2017-02-05 NOTE — Progress Notes (Signed)
Patient had one episode of bloody stool last night. She c/o intermittent left lower abdominal pain and a burning upper abdominal pain, denied need for pain med for the same. Intermittent nausea. Vitals stable.

## 2017-02-06 ENCOUNTER — Inpatient Hospital Stay (HOSPITAL_COMMUNITY): Payer: BC Managed Care – PPO

## 2017-02-06 LAB — BASIC METABOLIC PANEL
ANION GAP: 5 (ref 5–15)
BUN: 7 mg/dL (ref 6–20)
CHLORIDE: 111 mmol/L (ref 101–111)
CO2: 25 mmol/L (ref 22–32)
Calcium: 8.3 mg/dL — ABNORMAL LOW (ref 8.9–10.3)
Creatinine, Ser: 0.76 mg/dL (ref 0.44–1.00)
Glucose, Bld: 103 mg/dL — ABNORMAL HIGH (ref 65–99)
POTASSIUM: 3.1 mmol/L — AB (ref 3.5–5.1)
SODIUM: 141 mmol/L (ref 135–145)

## 2017-02-06 LAB — CBC
HCT: 25 % — ABNORMAL LOW (ref 36.0–46.0)
HCT: 28 % — ABNORMAL LOW (ref 36.0–46.0)
HEMOGLOBIN: 8 g/dL — AB (ref 12.0–15.0)
HEMOGLOBIN: 8.9 g/dL — AB (ref 12.0–15.0)
MCH: 27.3 pg (ref 26.0–34.0)
MCH: 27.2 pg (ref 26.0–34.0)
MCHC: 32 g/dL (ref 30.0–36.0)
MCHC: 31.8 g/dL (ref 30.0–36.0)
MCV: 85.3 fL (ref 78.0–100.0)
MCV: 85.6 fL (ref 78.0–100.0)
PLATELETS: 261 10*3/uL (ref 150–400)
Platelets: 301 10*3/uL (ref 150–400)
RBC: 2.93 MIL/uL — AB (ref 3.87–5.11)
RBC: 3.27 MIL/uL — ABNORMAL LOW (ref 3.87–5.11)
RDW: 13.3 % (ref 11.5–15.5)
RDW: 13.4 % (ref 11.5–15.5)
WBC: 9.2 10*3/uL (ref 4.0–10.5)
WBC: 9 10*3/uL (ref 4.0–10.5)

## 2017-02-06 LAB — RAPID STREP SCREEN (MED CTR MEBANE ONLY): Streptococcus, Group A Screen (Direct): NEGATIVE

## 2017-02-06 MED ORDER — TECHNETIUM TC 99M-LABELED RED BLOOD CELLS IV KIT
25.0000 | PACK | Freq: Once | INTRAVENOUS | Status: AC | PRN
  Administered 2017-02-06: 25 via INTRAVENOUS

## 2017-02-06 MED ORDER — POTASSIUM CHLORIDE CRYS ER 20 MEQ PO TBCR
40.0000 meq | EXTENDED_RELEASE_TABLET | Freq: Once | ORAL | Status: AC
  Administered 2017-02-06: 40 meq via ORAL
  Filled 2017-02-06: qty 2

## 2017-02-06 MED ORDER — BENZOCAINE 10 % MT GEL
Freq: Three times a day (TID) | OROMUCOSAL | Status: DC | PRN
  Administered 2017-02-06: 13:00:00 via OROMUCOSAL
  Filled 2017-02-06: qty 9

## 2017-02-06 MED ORDER — SODIUM CHLORIDE 0.9 % IV SOLN
8.0000 mg/h | INTRAVENOUS | Status: DC
  Administered 2017-02-06 – 2017-02-07 (×3): 8 mg/h via INTRAVENOUS
  Filled 2017-02-06 (×7): qty 80

## 2017-02-06 MED ORDER — SODIUM CHLORIDE 0.9 % IV BOLUS (SEPSIS)
1000.0000 mL | Freq: Once | INTRAVENOUS | Status: AC
  Administered 2017-02-06: 1000 mL via INTRAVENOUS

## 2017-02-06 NOTE — Progress Notes (Signed)
Family Medicine Teaching Service Daily Progress Note Intern Pager: [phone]  Patient name: Breanna [name] Medical record number: [id number] Date of birth: Jun 16, 1971 Age: 46 y.o. Gender: female  Primary Care Provider: Blair Heys, PA-C Consultants: Gastroenterology Code Status: Full   Pt Overview and Major Events to Date:  6/21 admitted for hematochezia  Assessment and Plan: Breanna [name] is a 46 y.o. female presenting with several episodes of BRBPR since this afternoon. PMH is significant for allergy-induced asthma, chronic low back pain s/p L5-S1 fusion in 2008, diverticulitis, hypothyroidism, endometriosis, GERD, and prolactinoma.   GI bleed likely diverticular bleed: mixed dark and BRBPR, another bloody BM this morning. Hgb continues to decrease from 8.8>9.1>8.0. Last Colonoscopy 2017 with some diverticuli. MAP 61 this morning.   - Monitor on Telemetry - Continue IV protonix - Clear liquid diet - Twice a day CBC  - Gastroenterology consulted, appreciate their recommendations  -Continue PPI, clear liquids, may possibly do a tagged RBC study she continues to bleed if this is positive do angiogram and if this is negative they would consider a colonoscopy.  they're not going to do any studies today.   Hypotension: MAP is in the low 60's. Likely from blood loss  - Will give 1 L bolus as patient's  -Continue to monitor -May consider maintenance fluids if patient is not tolerating her clear liquid diet although she is tolerating currently   Abdominal pain, improved: H/o diverticulitis. Current discomfort feels similar to past episodes but no leukocytosis  - Continue to monitor - Tramadol 50 mg q6h prn  GERD: Has been on aciphex - IV protonix in setting of acute GI bleed  Hypokalemia: K of 3.1 this morning  - 40 meq KDUR  Allergies and sore throat : Recently started on breo inhaler but requests not to continue while hospitalized, as thinks it has contributed to recent sore  throat.   - Continue home singulair and flonase -We will do rapid strep for sore throat -May consider treating for oral/esophageal candidiasis if sore throat persists (could have developed from steroid portion of Brio inhaler she doesn't rinse her mouth fully after usage )   Chronic low back pain: Follows with a pain management clinic. Has lidocaine patches and norco as home medications, though last dose of the latter was 2 weeks ago. - Continue home lidoderm patch  Persistent cough: Recent CXR performed for persistent cough and was negative. In setting of nasal congestion suspect post-nasal drip.  - Continue home flonase and prn chloraseptic spray  Hypothyroidism: - Continue home synthroid 50 mcg  Prolactinoma: Now asymptomatic. Says she recently stopped treatment for this. Last MRI 01/03/17 showed no pituitary mass.   FEN/GI: clear liquid diet, IV protonix  Prophylaxis: SCDs  Disposition: pending GI recommendations  Subjective:  Patient had about 3 bloody bowel movements last night between hours of 7 and 10 PM but none after she went to sleep after that. She had 2 episodes of small clots this morning. She notes that she had a rough night because she is feeling nauseous with headache and sore throat. Denies any dizziness or lightheadedness. Is tolerating clear liquids okay.  Objective: Temp:  [98 F (36.7 C)-98.5 F (36.9 C)] 98.4 F (36.9 C) (06/24 0354) Pulse Rate:  [74-106] 74 (06/24 0600) Resp:  [12-25] 15 (06/24 0600) BP: (86-127)/(49-77) 86/49 (06/24 0600) SpO2:  [95 %-100 %] 99 % (06/24 0600) Physical Exam: General: Sitting up in bed. in NAD HEENT: Unable to visualize posterior pharynx due to tongue  obstruction, bilateral anterior cervical lymphadenopathy and no nasal discharge  Cardiovascular: RRR, no murmurs Respiratory: CTAB, normal effort on room air Abdomen: mildly TTP over LLQ, no guarding or rebound. soft, nondistended, + bs Extremities: no LE edema Skin:  warm and dry. No pallor  Laboratory:  Recent Labs Lab 02/05/17 0244 02/05/17 1700 02/06/17 0232  WBC 7.4 7.0 9.2  HGB 8.8* 9.1* 8.0*  HCT 28.2* 28.3* 25.0*  PLT 259 278 261    Recent Labs Lab 02/03/17 1959 02/04/17 0031 02/05/17 0244 02/06/17 0232  NA 138 141 144 141  K 3.8 3.6 3.2* 3.1*  CL 109 112* 112* 111  CO2 22 21* 23 25  BUN 18 14 7 7   CREATININE 0.73 0.65 0.69 0.76  CALCIUM 8.5* 8.3* 8.3* 8.3*  PROT 5.9*  --   --   --   BILITOT 0.4  --   --   --   ALKPHOS 55  --   --   --   ALT 17  --   --   --   AST 18  --   --   --   GLUCOSE 110* 115* 93 103*    Imaging/Diagnostic Tests: No results found.   Carlyle Dolly, MD 02/06/2017, 7:42 AM PGY-2, [place] Intern pager: [phone], text pages welcome

## 2017-02-06 NOTE — Progress Notes (Signed)
Subjective: Several small bloody stools + clots over past 24 hours. Having some burning epigastric and left upper quadrant and retrosternal burning discomfort.  Objective: Vital signs in last 24 hours: Temp:  [98 F (36.7 C)-98.5 F (36.9 C)] 98.3 F (36.8 C) (06/24 0734) Pulse Rate:  [71-106] 106 (06/24 1000) Resp:  [10-25] 17 (06/24 1000) BP: (86-127)/(49-77) 114/73 (06/24 1000) SpO2:  [95 %-100 %] 100 % (06/24 1000) Weight change:  Last BM Date: 02/06/17  PE: GEN:  NAD ABD:  Mild upper abdominal tenderness without peritonitis.  Lab Results: CBC    Component Value Date/Time   WBC 9.2 02/06/2017 0232   RBC 2.93 (L) 02/06/2017 0232   HGB 8.0 (L) 02/06/2017 0232   HCT 25.0 (L) 02/06/2017 0232   PLT 261 02/06/2017 0232   MCV 85.3 02/06/2017 0232   MCH 27.3 02/06/2017 0232   MCHC 32.0 02/06/2017 0232   RDW 13.3 02/06/2017 0232   LYMPHSABS 2.4 02/03/2017 1959   MONOABS 0.5 02/03/2017 1959   EOSABS 0.2 02/03/2017 1959   BASOSABS 0.1 02/03/2017 1959   CMP     Component Value Date/Time   NA 141 02/06/2017 0232   K 3.1 (L) 02/06/2017 0232   CL 111 02/06/2017 0232   CO2 25 02/06/2017 0232   GLUCOSE 103 (H) 02/06/2017 0232   BUN 7 02/06/2017 0232   CREATININE 0.76 02/06/2017 0232   CALCIUM 8.3 (L) 02/06/2017 0232   PROT 5.9 (L) 02/03/2017 1959   ALBUMIN 3.2 (L) 02/03/2017 1959   AST 18 02/03/2017 1959   ALT 17 02/03/2017 1959   ALKPHOS 55 02/03/2017 1959   BILITOT 0.4 02/03/2017 1959   GFRNONAA >60 02/06/2017 0232   GFRAA >60 02/06/2017 0232   Assessment:  1.  Abdominal pain, mild, doubt diverticulitis.  Evolving; seems different nature and location from yesterday.  Possibly diverticular spasm from GI bleeding.  Possible some component of GERD. 2.  GI bleeding, suspect diverticular, maroon with clots, normal BUN 3.  Acute blood loss anemia.  Plan:  1.  PPI. 2.  Clear liquids only; do not advance. 3.  If she has further bleeding, would do tagged RBC study as  next step in management; if this positive-->angiogram; if this is negative-->consider colonoscopy. 4.  [facility] will follow.   Landry Dyke 02/06/2017, 11:00 AM   Pager [phone] If no answer or after 5 PM call [phone]

## 2017-02-06 NOTE — Progress Notes (Signed)
Dr.Dallio notified about Hgb 8.0 and SBP 80's. Patient continues to have loose bloody stools. No orders received at this time I will continue to monitor.

## 2017-02-07 LAB — CBC
HCT: 27.2 % — ABNORMAL LOW (ref 36.0–46.0)
HEMATOCRIT: 27.8 % — AB (ref 36.0–46.0)
HEMOGLOBIN: 8.5 g/dL — AB (ref 12.0–15.0)
HEMOGLOBIN: 8.7 g/dL — AB (ref 12.0–15.0)
MCH: 26.8 pg (ref 26.0–34.0)
MCH: 27.3 pg (ref 26.0–34.0)
MCHC: 31.3 g/dL (ref 30.0–36.0)
MCHC: 31.3 g/dL (ref 30.0–36.0)
MCV: 85.8 fL (ref 78.0–100.0)
MCV: 87.1 fL (ref 78.0–100.0)
PLATELETS: 310 10*3/uL (ref 150–400)
Platelets: 327 10*3/uL (ref 150–400)
RBC: 3.17 MIL/uL — AB (ref 3.87–5.11)
RBC: 3.19 MIL/uL — AB (ref 3.87–5.11)
RDW: 13.5 % (ref 11.5–15.5)
RDW: 13.6 % (ref 11.5–15.5)
WBC: 9 10*3/uL (ref 4.0–10.5)
WBC: 9.2 10*3/uL (ref 4.0–10.5)

## 2017-02-07 LAB — BASIC METABOLIC PANEL
ANION GAP: 6 (ref 5–15)
BUN: 6 mg/dL (ref 6–20)
CO2: 26 mmol/L (ref 22–32)
Calcium: 8.7 mg/dL — ABNORMAL LOW (ref 8.9–10.3)
Chloride: 108 mmol/L (ref 101–111)
Creatinine, Ser: 0.72 mg/dL (ref 0.44–1.00)
GFR calc non Af Amer: >60 mL/min (ref >60)
Glucose, Bld: 99 mg/dL (ref 65–99)
Potassium: 3.5 mmol/L (ref 3.5–5.1)
SODIUM: 140 mmol/L (ref 135–145)

## 2017-02-07 MED ORDER — ZOLPIDEM TARTRATE 5 MG PO TABS
5.0000 mg | ORAL_TABLET | Freq: Once | ORAL | Status: AC
  Administered 2017-02-07: 5 mg via ORAL
  Filled 2017-02-07: qty 1

## 2017-02-07 MED ORDER — SODIUM CHLORIDE 0.9 % IV SOLN
INTRAVENOUS | Status: DC
  Administered 2017-02-07: 16:00:00 via INTRAVENOUS

## 2017-02-07 NOTE — Progress Notes (Signed)
[facility] Progress Note  Breanna [name] 46 y.o. 1971-02-15  CC:  GI bleed   Subjective: No further bleeding episode last night or this morning. Patient had a bowel movement around 2:30 pm yesterday which showed small amount of bright blood. Abdominal pain is improving  ROS : Negative for chest pain and shortness of breath   Objective: Vital signs in last 24 hours: Vitals:   02/07/17 0341 02/07/17 0800  BP:  105/72  Pulse:  (!) 114  Resp:  17  Temp: 97.4 F (36.3 C) 98 F (36.7 C)    Physical Exam:  General:  Alert, cooperative, no distress, appears stated age  Head:  Normocephalic, without obvious abnormality, atraumatic  Eyes:  , EOM's intact,   Lungs:   Clear to auscultation bilaterally, respirations unlabored  Heart:  Regular rate and rhythm, S1, S2 normal  Abdomen:   Soft, Mild left upper quadrant discomfort on palpation,, bowel sounds active all four quadrants,  no masses, no peritoneal signs   Extremities: Extremities normal, atraumatic, no  edema  Pulses: 2+ and symmetric    Lab Results:  Recent Labs  02/06/17 0232 02/07/17 0657  NA 141 140  K 3.1* 3.5  CL 111 108  CO2 25 26  GLUCOSE 103* 99  BUN 7 6  CREATININE 0.76 0.72  CALCIUM 8.3* 8.7*   No results for input(s): AST, ALT, ALKPHOS, BILITOT, PROT, ALBUMIN in the last 72 hours.  Recent Labs  02/06/17 2000 02/07/17 0657  WBC 9.0 9.0  HGB 8.9* 8.5*  HCT 28.0* 27.2*  MCV 85.6 85.8  PLT 301 310   No results for input(s): LABPROT, INR in the last 72 hours.    Assessment/Plan: - GI bleed with bleeding scan showing evidence of upper GI bleed from the stomach. - Acute blood loss anemia. - Left Upper quadrant abdominal pain - NSAIDs use  Recommendations ----------------------- - Patient had recent excessive use of NSAIDs with use of 2-4 tablets of Aleve every 4 hours since last several weeks. Bleeding scan showed activity within the stomach, although patient has a normal BUN. No  further evidence of active bleeding since yesterday afternoon.  - Plan for EGD tomorrow for further evaluation. Nothing by mouth past midnight. Monitor H&H. Continue PPI. - Further plan based on endoscopic finding   Otis Brace MD, FACP 02/07/2017, 8:33 AM  Pager [phone]  If no answer or after 5 PM call [phone]

## 2017-02-07 NOTE — Progress Notes (Signed)
Family Medicine Teaching Service Daily Progress Note Intern Pager: [phone]  Patient name: Breanna [name] Medical record number: [id number] Date of birth: 1970/11/16 Age: 46 y.o. Gender: female  Primary Care Provider: Blair Heys, PA-C Consultants: Gastroenterology Code Status: Full   Pt Overview and Major Events to Date:  6/21 admitted for hematochezia  Assessment and Plan: Breanna [name] is a 46 y.o. female presenting with several episodes of BRBPR since this afternoon. PMH is significant for allergy-induced asthma, chronic low back pain s/p L5-S1 fusion in 2008, diverticulitis, hypothyroidism, endometriosis, GERD, and prolactinoma.   Upper GI bleed: no BMs this morning. Hgb decreased  8.8>9.1>8.0>8.9>8.5 but is stable. Tagged RBC scan showed upper GI bleed from stomach and no lower bleeding source. Likely from recent excessive NSAID use . - Monitor on Telemetry - Continue IV protonix - Clear liquid diet - Twice a day CBC  - Gastroenterology consulted, appreciate their recommendations  - Continue PPI, clear liquids and NPO at midnight  - plan for EGD tomorrow 6/26  Hypotension, improved: MAP has been intermittently in the low 60's, 83 this morning. Likely from blood loss. Is intermittently symptomatic with HA.  - s/p 1 L bolus yesterday - Continue to monitor - Consider mIVF if MAP persistently low, patient is tolerating clear liquid diet.  Abdominal pain, improved: H/o diverticulitis. Current discomfort feels similar to past episodes but no leukocytosis  - Continue to monitor - Tramadol 50 mg q6h prn  GERD: Has been on aciphex - IV protonix in setting of acute GI bleed  Hypokalemia, resolved: K of 3.5 this morning  - monitor  Allergies and sore throat : Recently started on breo inhaler but requests not to continue while hospitalized, as thinks it has contributed to recent sore throat.  Rapid strep neg. - Continue home singulair and flonase - Consider treating for  oral/esophageal candidiasis if sore throat persists (could have developed from steroid portion of Brio inhaler she doesn't rinse her mouth fully after usage) but no exudates   Chronic low back pain: Follows with a pain management clinic. Has lidocaine patches and norco as home medications, though last dose of the latter was 2 weeks ago. - Continue home lidoderm patch  Persistent cough: Recent CXR performed for persistent cough and was negative. In setting of nasal congestion suspect post-nasal drip.  - Continue home flonase and prn chloraseptic spray  Hypothyroidism: - Continue home synthroid 50 mcg  Prolactinoma: Now asymptomatic. Says she recently stopped treatment for this. Last MRI 01/03/17 showed no pituitary mass.   FEN/GI: clear liquid diet, IV protonix  Prophylaxis: SCDs  Disposition: pending EGD  Subjective:  States feels much improved from yesterday. No BMs today and abdominal pain is now mild. Has periodically had bad HA which she notices correlates with low BP, only mild currently.  Denies any dizziness or lightheadedness. Is tolerating clear liquids okay.  Objective: Temp:  [97.4 F (36.3 C)-98.3 F (36.8 C)] 97.4 F (36.3 C) (06/25 0341) Pulse Rate:  [71-113] 90 (06/25 0300) Resp:  [7-19] 15 (06/25 0300) BP: (87-125)/(51-86) 95/51 (06/25 0300) SpO2:  [94 %-100 %] 96 % (06/25 0300) Physical Exam: General: Sitting up in bed. in NAD HEENT: Mild erythema of posterior pharynx no exudates, tongue normal, bilateral anterior cervical lymphadenopathy and no nasal discharge   Cardiovascular: RRR, no murmurs Respiratory: CTAB, normal effort on room air Abdomen: mildly TTP over LLQ, no guarding or rebound. soft, nondistended, + bs Extremities: no LE edema Skin: warm and dry. No pallor  Laboratory:  Recent Labs Lab 02/05/17 1700 02/06/17 0232 02/06/17 2000  WBC 7.0 9.2 9.0  HGB 9.1* 8.0* 8.9*  HCT 28.3* 25.0* 28.0*  PLT 278 261 301    Recent Labs Lab  02/03/17 1959 02/04/17 0031 02/05/17 0244 02/06/17 0232  NA 138 141 144 141  K 3.8 3.6 3.2* 3.1*  CL 109 112* 112* 111  CO2 22 21* 23 25  BUN 18 14 7 7   CREATININE 0.73 0.65 0.69 0.76  CALCIUM 8.5* 8.3* 8.3* 8.3*  PROT 5.9*  --   --   --   BILITOT 0.4  --   --   --   ALKPHOS 55  --   --   --   ALT 17  --   --   --   AST 18  --   --   --   GLUCOSE 110* 115* 93 103*    Imaging/Diagnostic Tests: Nm Gi Blood Loss  Result Date: 02/06/2017 CLINICAL DATA:  Symptoms began 02/03/17. Bright red blood from her rectum. Several bloody stools over the past 24 hours. Epigastric pain. Diverticulosis and GERD. EXAM: NUCLEAR MEDICINE GASTROINTESTINAL BLEEDING SCAN TECHNIQUE: Sequential abdominal images were obtained following intravenous administration of Tc-61m labeled red blood cells. RADIOPHARMACEUTICALS:  mCi Tc-60m in-vitro labeled red cells. COMPARISON:  None. FINDINGS: Activity is seen within the stomach, which over the exam, progresses into proximal small bowel. This raises suspicion for a gastric source of GI bleeding. There is no evidence, however, of a lower GI bleed. Exam is otherwise unremarkable. IMPRESSION: 1. Evidence of an upper GI bleed from the stomach. No evidence of a lower gastrointestinal bleeding source. Electronically Signed   By: Lajean Manes M.D.   On: 02/06/2017 20:01     Bufford Lope, DO 02/07/2017, 7:14 AM PGY-1, [facility] Intern pager: [phone], text pages welcome

## 2017-02-08 ENCOUNTER — Inpatient Hospital Stay (HOSPITAL_COMMUNITY): Payer: BC Managed Care – PPO | Admitting: Critical Care Medicine

## 2017-02-08 ENCOUNTER — Encounter (HOSPITAL_COMMUNITY): Payer: Self-pay

## 2017-02-08 ENCOUNTER — Encounter (HOSPITAL_COMMUNITY)
Admission: EM | Disposition: A | Discharge status: Home / Self Care | Payer: Self-pay | Source: Home / Self Care | Attending: Family Medicine

## 2017-02-08 DIAGNOSIS — K219 Gastro-esophageal reflux disease without esophagitis: Secondary | ICD-10-CM

## 2017-02-08 DIAGNOSIS — K922 Gastrointestinal hemorrhage, unspecified: Secondary | ICD-10-CM

## 2017-02-08 DIAGNOSIS — N3090 Cystitis, unspecified without hematuria: Secondary | ICD-10-CM

## 2017-02-08 HISTORY — PX: ESOPHAGOGASTRODUODENOSCOPY (EGD) WITH PROPOFOL: SHX5813

## 2017-02-08 LAB — CBC
HCT: 27.4 % — ABNORMAL LOW (ref 36.0–46.0)
Hemoglobin: 8.6 g/dL — ABNORMAL LOW (ref 12.0–15.0)
MCH: 27.3 pg (ref 26.0–34.0)
MCHC: 31.4 g/dL (ref 30.0–36.0)
MCV: 87 fL (ref 78.0–100.0)
PLATELETS: 343 10*3/uL (ref 150–400)
RBC: 3.15 MIL/uL — ABNORMAL LOW (ref 3.87–5.11)
RDW: 13.6 % (ref 11.5–15.5)
WBC: 9.9 10*3/uL (ref 4.0–10.5)

## 2017-02-08 LAB — CULTURE, GROUP A STREP (THRC)

## 2017-02-08 LAB — URINALYSIS, ROUTINE W REFLEX MICROSCOPIC
Bilirubin Urine: NEGATIVE
GLUCOSE, UA: NEGATIVE mg/dL
Ketones, ur: NEGATIVE mg/dL
NITRITE: NEGATIVE
PH: 7 (ref 5.0–8.0)
Protein, ur: NEGATIVE mg/dL
SPECIFIC GRAVITY, URINE: 1.008 (ref 1.005–1.030)

## 2017-02-08 LAB — BASIC METABOLIC PANEL
ANION GAP: 6 (ref 5–15)
BUN: 5 mg/dL — ABNORMAL LOW (ref 6–20)
CALCIUM: 8.7 mg/dL — AB (ref 8.9–10.3)
CO2: 24 mmol/L (ref 22–32)
Chloride: 110 mmol/L (ref 101–111)
Creatinine, Ser: 0.84 mg/dL (ref 0.44–1.00)
Glucose, Bld: 108 mg/dL — ABNORMAL HIGH (ref 65–99)
Potassium: 3.4 mmol/L — ABNORMAL LOW (ref 3.5–5.1)
Sodium: 140 mmol/L (ref 135–145)

## 2017-02-08 SURGERY — ESOPHAGOGASTRODUODENOSCOPY (EGD) WITH PROPOFOL
Anesthesia: Monitor Anesthesia Care

## 2017-02-08 MED ORDER — ONDANSETRON HCL 4 MG/2ML IJ SOLN
INTRAMUSCULAR | Status: DC | PRN
  Administered 2017-02-08: 4 mg via INTRAVENOUS

## 2017-02-08 MED ORDER — PHENYLEPHRINE 40 MCG/ML (10ML) SYRINGE FOR IV PUSH (FOR BLOOD PRESSURE SUPPORT)
PREFILLED_SYRINGE | INTRAVENOUS | Status: DC | PRN
  Administered 2017-02-08: 80 ug via INTRAVENOUS
  Administered 2017-02-08: 40 ug via INTRAVENOUS

## 2017-02-08 MED ORDER — MIDAZOLAM HCL 5 MG/5ML IJ SOLN
INTRAMUSCULAR | Status: DC | PRN
  Administered 2017-02-08: 2 mg via INTRAVENOUS

## 2017-02-08 MED ORDER — POTASSIUM CHLORIDE CRYS ER 20 MEQ PO TBCR
20.0000 meq | EXTENDED_RELEASE_TABLET | Freq: Once | ORAL | Status: AC
  Administered 2017-02-08: 20 meq via ORAL
  Filled 2017-02-08: qty 1

## 2017-02-08 MED ORDER — PROPOFOL 500 MG/50ML IV EMUL
INTRAVENOUS | Status: DC | PRN
  Administered 2017-02-08: 100 ug/kg/min via INTRAVENOUS

## 2017-02-08 MED ORDER — ZOLPIDEM TARTRATE 5 MG PO TABS
5.0000 mg | ORAL_TABLET | Freq: Every evening | ORAL | Status: DC | PRN
Start: 2017-02-08 — End: 2017-02-09
  Administered 2017-02-08: 5 mg via ORAL
  Filled 2017-02-08: qty 1

## 2017-02-08 MED ORDER — PANTOPRAZOLE SODIUM 40 MG PO TBEC
40.0000 mg | DELAYED_RELEASE_TABLET | Freq: Every day | ORAL | Status: DC
  Administered 2017-02-08 – 2017-02-09 (×2): 40 mg via ORAL
  Filled 2017-02-08 (×2): qty 1

## 2017-02-08 MED ORDER — DEXAMETHASONE SODIUM PHOSPHATE 10 MG/ML IJ SOLN
INTRAMUSCULAR | Status: DC | PRN
  Administered 2017-02-08: 4 mg via INTRAVENOUS

## 2017-02-08 MED ORDER — LIDOCAINE HCL (CARDIAC) 20 MG/ML IV SOLN
INTRAVENOUS | Status: DC | PRN
  Administered 2017-02-08: 60 mg via INTRAVENOUS

## 2017-02-08 MED ORDER — PROPOFOL 10 MG/ML IV BOLUS
INTRAVENOUS | Status: DC | PRN
  Administered 2017-02-08 (×2): 60 mg via INTRAVENOUS

## 2017-02-08 MED ORDER — CEPHALEXIN 500 MG PO CAPS
500.0000 mg | ORAL_CAPSULE | Freq: Two times a day (BID) | ORAL | Status: DC
  Administered 2017-02-08 – 2017-02-09 (×3): 500 mg via ORAL
  Filled 2017-02-08 (×3): qty 1

## 2017-02-08 SURGICAL SUPPLY — 15 items

## 2017-02-08 NOTE — Transfer of Care (Signed)
Immediate Anesthesia Transfer of Care Note  Patient: Breanna [name]  Procedure(s) Performed: Procedure(s): ESOPHAGOGASTRODUODENOSCOPY (EGD) WITH PROPOFOL (N/A)  Patient Location: Endoscopy Unit  Anesthesia Type:MAC  Level of Consciousness: awake, alert  and oriented  Airway & Oxygen Therapy: Patient Spontanous Breathing and Patient connected to nasal cannula oxygen  Post-op Assessment: Report given to RN and Post -op Vital signs reviewed and stable  Post vital signs: Reviewed and stable  Last Vitals:  Vitals:   02/08/17 0700 02/08/17 1027  BP: (!) 91/55 (!) 109/58  Pulse: 84 76  Resp: 16 16  Temp: 36.8 C 36.7 C    Last Pain:  Vitals:   02/08/17 1027  TempSrc: Oral  PainSc: 4       Patients Stated Pain Goal: 0 (21/19/41 7408)  Complications: No apparent anesthesia complications

## 2017-02-08 NOTE — Anesthesia Preprocedure Evaluation (Signed)
Anesthesia Evaluation  Patient identified by MRN, date of birth, ID band Patient awake    Reviewed: Allergy & Precautions, NPO status , Patient's Chart, lab work & pertinent test results  History of Anesthesia Complications Negative for: history of anesthetic complications  Airway Mallampati: I  TM Distance: >3 FB Neck ROM: Full    Dental  (+) Dental Advisory Given   Pulmonary asthma (last inhaler use over a week ago) ,    breath sounds clear to auscultation       Cardiovascular negative cardio ROS   Rhythm:Regular Rate:Normal     Neuro/Psych H/o benign prolactinoma negative neurological ROS     GI/Hepatic Neg liver ROS, GERD  Medicated and Controlled,  Endo/Other  Morbid obesity  Renal/GU negative Renal ROS     Musculoskeletal   Abdominal (+) + obese,   Peds  Hematology  (+) Blood dyscrasia (Hb 8.6), anemia ,   Anesthesia Other Findings   Reproductive/Obstetrics menopausal                             Anesthesia Physical Anesthesia Plan  ASA: II  Anesthesia Plan: MAC   Post-op Pain Management:    Induction:   PONV Risk Score and Plan: 2 and Ondansetron and Dexamethasone  Airway Management Planned: Natural Airway and Nasal Cannula  Additional Equipment:   Intra-op Plan:   Post-operative Plan:   Informed Consent: I have reviewed the patients History and Physical, chart, labs and discussed the procedure including the risks, benefits and alternatives for the proposed anesthesia with the patient or authorized representative who has indicated his/her understanding and acceptance.   Dental advisory given  Plan Discussed with: CRNA and Surgeon  Anesthesia Plan Comments: (Plan routine monitors, MAC)        Anesthesia Quick Evaluation

## 2017-02-08 NOTE — H&P (View-Only) (Signed)
Family Medicine Teaching Service Daily Progress Note Intern Pager: [phone]  Patient name: JANIQUE HOEFER Medical record number: [id number] Date of birth: 1971-01-11 Age: 46 y.o. Gender: female  Primary Care Provider: Blair Heys, PA-C Consultants: Gastroenterology Code Status: Full   Pt Overview and Major Events to Date:  6/21 admitted for hematochezia  Assessment and Plan: DEEM MARMOL is a 46 y.o. female presenting with several episodes of BRBPR since this afternoon. PMH is significant for allergy-induced asthma, chronic low back pain s/p L5-S1 fusion in 2008, diverticulitis, hypothyroidism, endometriosis, GERD, and prolactinoma.   Upper GI bleed: no BMs this morning. Hgb decreased  8.8>9.1>8.0>8.9>8.5 but is stable. Tagged RBC scan showed upper GI bleed from stomach and no lower bleeding source. Likely from recent excessive NSAID use . - Monitor on Telemetry - Continue IV protonix - Clear liquid diet - Twice a day CBC  - Gastroenterology consulted, appreciate their recommendations  - Continue PPI, clear liquids and NPO at midnight  - plan for EGD tomorrow 6/26  Hypotension, improved: MAP has been intermittently in the low 60's, 83 this morning. Likely from blood loss. Is intermittently symptomatic with HA.  - s/p 1 L bolus yesterday - Continue to monitor - Consider mIVF if MAP persistently low, patient is tolerating clear liquid diet.  Abdominal pain, improved: H/o diverticulitis. Current discomfort feels similar to past episodes but no leukocytosis  - Continue to monitor - Tramadol 50 mg q6h prn  GERD: Has been on aciphex - IV protonix in setting of acute GI bleed  Hypokalemia, resolved: K of 3.5 this morning  - monitor  Allergies and sore throat : Recently started on breo inhaler but requests not to continue while hospitalized, as thinks it has contributed to recent sore throat.  Rapid strep neg. - Continue home singulair and flonase - Consider treating for  oral/esophageal candidiasis if sore throat persists (could have developed from steroid portion of Brio inhaler she doesn't rinse her mouth fully after usage) but no exudates   Chronic low back pain: Follows with a pain management clinic. Has lidocaine patches and norco as home medications, though last dose of the latter was 2 weeks ago. - Continue home lidoderm patch  Persistent cough: Recent CXR performed for persistent cough and was negative. In setting of nasal congestion suspect post-nasal drip.  - Continue home flonase and prn chloraseptic spray  Hypothyroidism: - Continue home synthroid 50 mcg  Prolactinoma: Now asymptomatic. Says she recently stopped treatment for this. Last MRI 01/03/17 showed no pituitary mass.   FEN/GI: clear liquid diet, IV protonix  Prophylaxis: SCDs  Disposition: pending EGD  Subjective:  States feels much improved from yesterday. No BMs today and abdominal pain is now mild. Has periodically had bad HA which she notices correlates with low BP, only mild currently.  Denies any dizziness or lightheadedness. Is tolerating clear liquids okay.  Objective: Temp:  [97.4 F (36.3 C)-98.3 F (36.8 C)] 97.4 F (36.3 C) (06/25 0341) Pulse Rate:  [71-113] 90 (06/25 0300) Resp:  [7-19] 15 (06/25 0300) BP: (87-125)/(51-86) 95/51 (06/25 0300) SpO2:  [94 %-100 %] 96 % (06/25 0300) Physical Exam: General: Sitting up in bed. in NAD HEENT: Mild erythema of posterior pharynx no exudates, tongue normal, bilateral anterior cervical lymphadenopathy and no nasal discharge   Cardiovascular: RRR, no murmurs Respiratory: CTAB, normal effort on room air Abdomen: mildly TTP over LLQ, no guarding or rebound. soft, nondistended, + bs Extremities: no LE edema Skin: warm and dry. No pallor  Laboratory:  Recent Labs Lab 02/05/17 1700 02/06/17 0232 02/06/17 2000  WBC 7.0 9.2 9.0  HGB 9.1* 8.0* 8.9*  HCT 28.3* 25.0* 28.0*  PLT 278 261 301    Recent Labs Lab  02/03/17 1959 02/04/17 0031 02/05/17 0244 02/06/17 0232  NA 138 141 144 141  K 3.8 3.6 3.2* 3.1*  CL 109 112* 112* 111  CO2 22 21* 23 25  BUN 18 14 7 7   CREATININE 0.73 0.65 0.69 0.76  CALCIUM 8.5* 8.3* 8.3* 8.3*  PROT 5.9*  --   --   --   BILITOT 0.4  --   --   --   ALKPHOS 55  --   --   --   ALT 17  --   --   --   AST 18  --   --   --   GLUCOSE 110* 115* 93 103*    Imaging/Diagnostic Tests: Nm Gi Blood Loss  Result Date: 02/06/2017 CLINICAL DATA:  Symptoms began 02/03/17. Bright red blood from her rectum. Several bloody stools over the past 24 hours. Epigastric pain. Diverticulosis and GERD. EXAM: NUCLEAR MEDICINE GASTROINTESTINAL BLEEDING SCAN TECHNIQUE: Sequential abdominal images were obtained following intravenous administration of Tc-62m labeled red blood cells. RADIOPHARMACEUTICALS:  mCi Tc-73m in-vitro labeled red cells. COMPARISON:  None. FINDINGS: Activity is seen within the stomach, which over the exam, progresses into proximal small bowel. This raises suspicion for a gastric source of GI bleeding. There is no evidence, however, of a lower GI bleed. Exam is otherwise unremarkable. IMPRESSION: 1. Evidence of an upper GI bleed from the stomach. No evidence of a lower gastrointestinal bleeding source. Electronically Signed   By: Lajean Manes M.D.   On: 02/06/2017 20:01     Bufford Lope, DO 02/07/2017, 7:14 AM PGY-1, [place] Intern pager: [phone], text pages welcome

## 2017-02-08 NOTE — Progress Notes (Signed)
Family Medicine Teaching Service Daily Progress Note Intern Pager: [phone]  Patient name: Breanna [name] Medical record number: [id number] Date of birth: 01-21-1971 Age: 46 y.o. Gender: female  Primary Care Provider: Blair Heys, PA-C Consultants: Gastroenterology Code Status: Full   Pt Overview and Major Events to Date:  6/21 admitted for hematochezia  Assessment and Plan: Breanna [name] is a 46 y.o. female presenting with several episodes of BRBPR since this afternoon. PMH is significant for allergy-induced asthma, chronic low back pain s/p L5-S1 fusion in 2008, diverticulitis, hypothyroidism, endometriosis, GERD, and prolactinoma.   Upper GI bleed: no BMs this morning. Hgb stable at 8.6. Tagged RBC scan showed upper GI bleed from stomach and no lower bleeding source. Likely from recent excessive NSAID use . - Monitor on Telemetry - Continue IV protonix - Twice a day CBC  - Gastroenterology consulted, appreciate their recommendations  - Continue PPI, NPO for procedure  - plan for EGD today 6/26  Hypotension, improved: MAP has been intermittently in the low 60's, in 80s this morning. Likely from blood loss. Is intermittently symptomatic with HA which is improved from yesterday - Continue to monitor - Consider mIVF if MAP persistently low, patient is tolerating clear liquid diet.  Dysuria with urinary frequency. New onset with suprapubic abdominal pain that is different from the upper midepigastric pain patient has been having. UA showing moderate leukocytes, many bacteria but negative for nitrates. Will treat as symptomatic uncomplicated cystitis - Start po keflex 500mg  BID for 3 days total  Abdominal pain: H/o diverticulitis. Current discomfort feels similar to past episodes but no leukocytosis   - Continue to monitor - Tramadol 50 mg q6h prn  GERD: Has been on aciphex - IV protonix in setting of acute GI bleed  Hypokalemia, resolved: K of 3.4 this morning  - replete  with Kdur 20 mEq this am - monitor  Allergies and sore throat : Recently started on breo inhaler but requests not to continue while hospitalized, as thinks it has contributed to recent sore throat.  Rapid strep neg. - Continue home singulair and flonase - Consider treating for oral/esophageal candidiasis if sore throat persists (could have developed from steroid portion of Brio inhaler she doesn't rinse her mouth fully after usage) but no exudates   Chronic low back pain: Follows with a pain management clinic. Has lidocaine patches and norco as home medications, though last dose of the latter was 2 weeks ago. - Continue home lidoderm patch  Persistent cough: Recent CXR performed for persistent cough and was negative. In setting of nasal congestion suspect post-nasal drip.  - Continue home flonase and prn chloraseptic spray  Hypothyroidism: - Continue home synthroid 50 mcg  Prolactinoma: Now asymptomatic. Says she recently stopped treatment for this. Last MRI 01/03/17 showed no pituitary mass.   FEN/GI: NPO for procedure. IV protonix  Prophylaxis: SCDs  Disposition: pending EGD  Subjective:  States feels much improved from yesterday. Small BMs that are bloody clots. NO HA today. New complaint that having dysuria and urinary frequency since yesterday afternoon with suprapubic abdominal pain that is more diffuse and different from the midepigastric and LLQ abd pain that she has had during her hospitalization. No fever/chills. No n/v  Objective: Temp:  [98.1 F (36.7 C)-98.7 F (37.1 C)] 98.3 F (36.8 C) (06/26 0700) Pulse Rate:  [84-97] 84 (06/26 0700) Resp:  [14-22] 16 (06/26 0700) BP: (91-109)/(55-71) 91/55 (06/26 0700) SpO2:  [96 %-100 %] 96 % (06/26 0311) Physical Exam: General: Sitting up  in chair. in NAD Cardiovascular: RRR, no murmurs Respiratory: CTAB, normal effort on room air Abdomen: mildly TTP LUQ and suprapubic area. no guarding or rebound. soft, nondistended, +  bs Extremities: no LE edema Skin: warm and dry. No pallor  Laboratory:  Recent Labs Lab 02/06/17 2000 02/07/17 0657 02/07/17 1759  WBC 9.0 9.0 9.2  HGB 8.9* 8.5* 8.7*  HCT 28.0* 27.2* 27.8*  PLT 301 310 327    Recent Labs Lab 02/03/17 1959  02/06/17 0232 02/07/17 0657 02/08/17 0331  NA 138  < > 141 140 140  K 3.8  < > 3.1* 3.5 3.4*  CL 109  < > 111 108 110  CO2 22  < > 25 26 24   BUN 18  < > 7 6 5*  CREATININE 0.73  < > 0.76 0.72 0.84  CALCIUM 8.5*  < > 8.3* 8.7* 8.7*  PROT 5.9*  --   --   --   --   BILITOT 0.4  --   --   --   --   ALKPHOS 55  --   --   --   --   ALT 17  --   --   --   --   AST 18  --   --   --   --   GLUCOSE 110*  < > 103* 99 108*  < > = values in this interval not displayed.  Imaging/Diagnostic Tests: No results found.   Bufford Lope, DO 02/08/2017, 8:13 AM PGY-1, [place] Intern pager: [phone], text pages welcome

## 2017-02-08 NOTE — Progress Notes (Signed)
[facility] Progress Note  Subjective: No maroon stool since yesterday  Objective: Vital signs in last 24 hours: Temp:  [98.1 F (36.7 C)-98.7 F (37.1 C)] 98.1 F (36.7 C) (06/26 1027) Pulse Rate:  [76-97] 76 (06/26 1027) Resp:  [14-22] 16 (06/26 1027) BP: (91-109)/(55-70) 109/58 (06/26 1027) SpO2:  [96 %-100 %] 100 % (06/26 1027) Weight change:    PE: Unchanged  Lab Results: Results for orders placed or performed during the hospital encounter of 02/03/17 (from the past 24 hour(s))  CBC     Status: Abnormal   Collection Time: 02/07/17  5:59 PM  Result Value Ref Range   WBC 9.2 4.0 - 10.5 K/uL   RBC 3.19 (L) 3.87 - 5.11 MIL/uL   Hemoglobin 8.7 (L) 12.0 - 15.0 g/dL   HCT 27.8 (L) 36.0 - 46.0 %   MCV 87.1 78.0 - 100.0 fL   MCH 27.3 26.0 - 34.0 pg   MCHC 31.3 30.0 - 36.0 g/dL   RDW 13.6 11.5 - 15.5 %   Platelets 327 150 - 400 K/uL  Basic metabolic panel     Status: Abnormal   Collection Time: 02/08/17  3:31 AM  Result Value Ref Range   Sodium 140 135 - 145 mmol/L   Potassium 3.4 (L) 3.5 - 5.1 mmol/L   Chloride 110 101 - 111 mmol/L   CO2 24 22 - 32 mmol/L   Glucose, Bld 108 (H) 65 - 99 mg/dL   BUN 5 (L) 6 - 20 mg/dL   Creatinine, Ser 0.84 0.44 - 1.00 mg/dL   Calcium 8.7 (L) 8.9 - 10.3 mg/dL   GFR calc non Af Amer >60 >60 mL/min   GFR calc Af Amer >60 >60 mL/min   Anion gap 6 5 - 15  CBC     Status: Abnormal   Collection Time: 02/08/17  8:15 AM  Result Value Ref Range   WBC 9.9 4.0 - 10.5 K/uL   RBC 3.15 (L) 3.87 - 5.11 MIL/uL   Hemoglobin 8.6 (L) 12.0 - 15.0 g/dL   HCT 27.4 (L) 36.0 - 46.0 %   MCV 87.0 78.0 - 100.0 fL   MCH 27.3 26.0 - 34.0 pg   MCHC 31.4 30.0 - 36.0 g/dL   RDW 13.6 11.5 - 15.5 %   Platelets 343 150 - 400 K/uL  Urinalysis, Routine w reflex microscopic     Status: Abnormal   Collection Time: 02/08/17  8:27 AM  Result Value Ref Range   Color, Urine YELLOW YELLOW   APPearance HAZY (A) CLEAR   Specific Gravity, Urine 1.008 1.005 -  1.030   pH 7.0 5.0 - 8.0   Glucose, UA NEGATIVE NEGATIVE mg/dL   Hgb urine dipstick SMALL (A) NEGATIVE   Bilirubin Urine NEGATIVE NEGATIVE   Ketones, ur NEGATIVE NEGATIVE mg/dL   Protein, ur NEGATIVE NEGATIVE mg/dL   Nitrite NEGATIVE NEGATIVE   Leukocytes, UA MODERATE (A) NEGATIVE   RBC / HPF 0-5 0 - 5 RBC/hpf   WBC, UA TOO NUMEROUS TO COUNT 0 - 5 WBC/hpf   Bacteria, UA MANY (A) NONE SEEN   Squamous Epithelial / LPF 0-5 (A) NONE SEEN   Mucous PRESENT     Studies/Results: Nm Gi Blood Loss  Result Date: 02/06/2017 CLINICAL DATA:  Symptoms began 02/03/17. Bright red blood from her rectum. Several bloody stools over the past 24 hours. Epigastric pain. Diverticulosis and GERD. EXAM: NUCLEAR MEDICINE GASTROINTESTINAL BLEEDING SCAN TECHNIQUE: Sequential abdominal images were obtained following intravenous administration of Tc-54m  labeled red blood cells. RADIOPHARMACEUTICALS:  mCi Tc-25m in-vitro labeled red cells. COMPARISON:  None. FINDINGS: Activity is seen within the stomach, which over the exam, progresses into proximal small bowel. This raises suspicion for a gastric source of GI bleeding. There is no evidence, however, of a lower GI bleed. Exam is otherwise unremarkable. IMPRESSION: 1. Evidence of an upper GI bleed from the stomach. No evidence of a lower gastrointestinal bleeding source. Electronically Signed   By: Lajean Manes M.D.   On: 02/06/2017 20:01      Assessment: GI bleeding of uncertain origin. Despite pooled RBC scan results, EGD was entirely negative except for small less than 5 mm proximal polyps the largest of which was sampled.  Plan: 1. Advance diet 2. Continue to monitor stools and hemoglobin 3. If bleeding continues and hemoglobin drops further, colonoscopy.    Chandria Rookstool C 02/08/2017, 12:17 PM  Pager [phone] If no answer or after 5 PM call [phone]

## 2017-02-08 NOTE — Op Note (Signed)
[facility] Patient Name: Breanna [name] Procedure Date : 02/08/2017 MRN: [id number] Attending MD: Missy Sabins , MD Date of Birth: Feb 21, 1971 CSN: [id number] Age: 46 Admit Type: Inpatient Procedure:                Upper GI endoscopy Indications:              Gastrointestinal bleeding of unknown origin,                            Abnormal MRI of the GI tract Providers:                Elyse Jarvis. Amedeo Plenty, MD, Cleda Daub, RN, Corliss Parish, Technician Referring MD:              Medicines:                Propofol per Anesthesia Complications:            No immediate complications. Estimated Blood Loss:     Estimated blood loss: none. Procedure:                Pre-Anesthesia Assessment:                           - Prior to the procedure, a History and Physical                            was performed, and patient medications and                            allergies were reviewed. The patient's tolerance of                            previous anesthesia was also reviewed. The risks                            and benefits of the procedure and the sedation                            options and risks were discussed with the patient.                            All questions were answered, and informed consent                            was obtained. Prior Anticoagulants: The patient has                            taken no previous anticoagulant or antiplatelet                            agents. ASA Grade Assessment: II - A patient with  mild systemic disease. After reviewing the risks                            and benefits, the patient was deemed in                            satisfactory condition to undergo the procedure.                           After obtaining informed consent, the endoscope was                            passed under direct vision. Throughout the                            procedure, the patient's blood  pressure, pulse, and                            oxygen saturations were monitored continuously. The                            Endoscope was introduced through the mouth, and                            advanced to the second part of duodenum. The upper                            GI endoscopy was accomplished without difficulty.                            The patient tolerated the procedure well. Scope In: Scope Out: Findings:      The examined esophagus was normal.      There is no endoscopic evidence of bleeding, erythema or mucosal       abnormalities in the stomach.      A few small sessile polyps with no stigmata of recent bleeding were       found in the gastric body. Biopsies were taken with a cold forceps for       histology.      The examined duodenum was normal. Impression:               - Normal esophagus.                           - A few gastric polyps. Biopsied.                           - Normal examined duodenum. Moderate Sedation:      no moderate sedation Recommendation:           - Await pathology results.                           - Observe patient's clinical course.                           -  Resume regular diet.                           - Check hemogram with white blood cell count and                            platelets tomorrow.                           - Continue present medications. Procedure Code(s):        --- Professional ---                           [id number], Esophagogastroduodenoscopy, flexible,                            transoral; with biopsy, single or multiple Diagnosis Code(s):        --- Professional ---                           K31.7, Polyp of stomach and duodenum                           K92.2, Gastrointestinal hemorrhage, unspecified                           R93.3, Abnormal findings on diagnostic imaging of                            other parts of digestive tract CPT copyright 2016 [employer]. All rights reserved. The  codes documented in this report are preliminary and upon coder review may  be revised to meet current compliance requirements. Missy Sabins, MD 02/08/2017 12:22:12 PM This report has been signed electronically. Number of Addenda: 0

## 2017-02-08 NOTE — Anesthesia Procedure Notes (Addendum)
Procedure Name: MAC Date/Time: 02/08/2017 12:00 PM Performed by: Merrilyn Puma B Pre-anesthesia Checklist: Patient identified, Emergency Drugs available, Suction available, Patient being monitored and Timeout performed Patient Re-evaluated:Patient Re-evaluated prior to inductionPlacement Confirmation: positive ETCO2 and breath sounds checked- equal and bilateral Dental Injury: Teeth and Oropharynx as per pre-operative assessment

## 2017-02-08 NOTE — Interval H&P Note (Signed)
History and Physical Interval Note:  02/08/2017 11:55 AM  Breanna [name]  has presented today for surgery, with the diagnosis of GI bleed  The various methods of treatment have been discussed with the patient and family. After consideration of risks, benefits and other options for treatment, the patient has consented to  Procedure(s): ESOPHAGOGASTRODUODENOSCOPY (EGD) WITH PROPOFOL (N/A) as a surgical intervention .  The patient's history has been reviewed, patient examined, no change in status, stable for surgery.  I have reviewed the patient's chart and labs.  Questions were answered to the patient's satisfaction.     Cadon Raczka C

## 2017-02-08 NOTE — Progress Notes (Signed)
Patient has complaint of new tingling of L forearm into her fingers and hand. She had an IV that infiltrated yesterday. She denies increased swelling of the area. Tapping over medial epicondyle can elicit numbness, though area of mildly firm swelling closer to lateral epicondyle. Patient has tried heat and ice without much relief. Recommended trying to keep arm straight and that ice would likely help more than heat. Will order L UE ultrasound to rule out clot given some redness around site of infiltration and area that feels like a cord.   Olene Floss, MD [place], PGY-2

## 2017-02-08 NOTE — Anesthesia Postprocedure Evaluation (Signed)
Anesthesia Post Note  Patient: Breanna [name]  Procedure(s) Performed: Procedure(s) (LRB): ESOPHAGOGASTRODUODENOSCOPY (EGD) WITH PROPOFOL (N/A)     Patient location during evaluation: Endoscopy Anesthesia Type: MAC Level of consciousness: awake and alert, oriented and patient cooperative Pain management: pain level controlled Vital Signs Assessment: post-procedure vital signs reviewed and stable Respiratory status: spontaneous breathing, nonlabored ventilation and respiratory function stable Cardiovascular status: stable and blood pressure returned to baseline Postop Assessment: no signs of nausea or vomiting Anesthetic complications: no    Last Vitals:  Vitals:   02/08/17 1250 02/08/17 1318  BP: (!) 97/58 100/64  Pulse: 85 78  Resp: 14 16  Temp:  37.6 C    Last Pain:  Vitals:   02/08/17 1318  TempSrc: Oral  PainSc:                  Lavonne Cass,E. Xandra Laramee

## 2017-02-09 ENCOUNTER — Inpatient Hospital Stay (HOSPITAL_COMMUNITY): Payer: BC Managed Care – PPO

## 2017-02-09 DIAGNOSIS — M79609 Pain in unspecified limb: Secondary | ICD-10-CM

## 2017-02-09 LAB — CBC
HCT: 27.5 % — ABNORMAL LOW (ref 36.0–46.0)
HEMOGLOBIN: 8.7 g/dL — AB (ref 12.0–15.0)
MCH: 28.1 pg (ref 26.0–34.0)
MCHC: 31.6 g/dL (ref 30.0–36.0)
MCV: 88.7 fL (ref 78.0–100.0)
Platelets: 245 10*3/uL (ref 150–400)
RBC: 3.1 MIL/uL — AB (ref 3.87–5.11)
RDW: 14 % (ref 11.5–15.5)
WBC: 10.5 10*3/uL (ref 4.0–10.5)

## 2017-02-09 LAB — BASIC METABOLIC PANEL
ANION GAP: 8 (ref 5–15)
BUN: 10 mg/dL (ref 6–20)
CHLORIDE: 111 mmol/L (ref 101–111)
CO2: 20 mmol/L — ABNORMAL LOW (ref 22–32)
Calcium: 8.9 mg/dL (ref 8.9–10.3)
Creatinine, Ser: 0.76 mg/dL (ref 0.44–1.00)
GFR calc Af Amer: >60 mL/min (ref >60)
GFR calc non Af Amer: >60 mL/min (ref >60)
Glucose, Bld: 120 mg/dL — ABNORMAL HIGH (ref 65–99)
POTASSIUM: 4 mmol/L (ref 3.5–5.1)
Sodium: 139 mmol/L (ref 135–145)

## 2017-02-09 MED ORDER — PANTOPRAZOLE SODIUM 40 MG PO TBEC: 40.0000 mg | DELAYED_RELEASE_TABLET | Freq: Every day | ORAL | 0 refills | Status: DC

## 2017-02-09 MED ORDER — CEPHALEXIN 500 MG PO CAPS: 500.0000 mg | ORAL_CAPSULE | Freq: Two times a day (BID) | ORAL | 0 refills | Status: AC

## 2017-02-09 MED ORDER — LORATADINE 10 MG PO TABS: 10.0000 mg | ORAL_TABLET | Freq: Every day | ORAL | 0 refills | Status: DC

## 2017-02-09 NOTE — Progress Notes (Addendum)
Pt complaining of new onset  left forearm tingling and increased pain moving down to fingers. IV infiltrated yesterday. Palpable radial pulse 2+. Pt applied heat during the day stated it didn't help and requested ice. Ice applied to arm. MD paged. MD stated she would be up to see pt.   Update: MD ordered Vascular US and  RN to help pt keep arm straight. RN wrapped pt arm with guaze and kerlix to keep straight per pt request. Pt now resting comfortably. Will continue to monitor.

## 2017-02-09 NOTE — Progress Notes (Signed)
[facility] Progress Note  Breanna [name] 46 y.o. Mar 29, 1971  CC:  GI bleed   Subjective: No further bleeding episode. Hemoglobin stable. EGD negative yesterday. Oral patient is doing better and wants to go home.  ROS : Negative for chest pain and shortness of breath   Objective: Vital signs in last 24 hours: Vitals:   02/09/17 1211 02/09/17 1213  BP:  112/62  Pulse:  (!) 51  Resp:    Temp: 98 F (36.7 C)     Physical Exam:  General:  Alert, cooperative, no distress, appears stated age  Head:  Normocephalic, without obvious abnormality, atraumatic  Eyes:  , EOM's intact,   Lungs:   Clear to auscultation bilaterally, respirations unlabored  Heart:  Regular rate and rhythm, S1, S2 normal  Abdomen:   Soft, Mild left upper quadrant discomfort on palpation,, bowel sounds active all four quadrants,  no masses, no peritoneal signs   Extremities: Extremities normal, atraumatic, no  edema  Pulses: 2+ and symmetric    Lab Results:  Recent Labs  02/08/17 0331 02/09/17 0332  NA 140 139  K 3.4* 4.0  CL 110 111  CO2 24 20*  GLUCOSE 108* 120*  BUN 5* 10  CREATININE 0.84 0.76  CALCIUM 8.7* 8.9   No results for input(s): AST, ALT, ALKPHOS, BILITOT, PROT, ALBUMIN in the last 72 hours.  Recent Labs  02/08/17 0815 02/09/17 0332  WBC 9.9 10.5  HGB 8.6* 8.7*  HCT 27.4* 27.5*  MCV 87.0 88.7  PLT 343 245   No results for input(s): LABPROT, INR in the last 72 hours.    Assessment/Plan: - GI bleed with bleeding scan showing evidence of upper GI bleed from the stomach. - Acute blood loss anemia. - Left Upper quadrant abdominal pain. Resolved - NSAIDs use  Recommendations ----------------------- - EGD negative yesterday. No further bleeding episodes. Patient is feeling better. Hemoglobin stable. - Patient prefers outpatient colonoscopy - Follow-up in GI clinic in 2 weeks after discharge. Okay to discharge from GI standpoint. GI will sign off. Call us back if  needed   Otis Brace MD, FACP 02/09/2017, 1:12 PM  Pager [phone]  If no answer or after 5 PM call [phone]

## 2017-02-09 NOTE — Progress Notes (Signed)
Discharge note. Patient and daughter educated at bedside. RN educated on signs and symptoms to look for, when to call the MD, medications and when to take them, and follow up appointments.   Patient discharged in wheelchair by NT.

## 2017-02-09 NOTE — Discharge Instructions (Signed)
It has been a pleasure taking care of you! You were admitted due to a GI bleed but the endoscopy did not show any source of bleeding, you should follow up with your GI doctor in 2 weeks for further management. Please avoid NSAIDs (ibuprofen, aleve, goody powder, motrin) and keep taking the acid reducer medication. We have also treated you with antibiotics for UTI, continue taking keflex for a total of 5 days, last dose on 02/12/17. For the superficial clot in your L forearm, use warm compresses.  There could be some changes made to your home medications during this hospitalization. Please, make sure to read the directions before you take them. The names and directions on how to take these medications are found on this discharge paper under medication section.  Please follow up at your primary care doctor's office and with your GI doctor.

## 2017-02-09 NOTE — Care Management Note (Signed)
Case Management Note  Patient Details  Name: Breanna [name] MRN: [id number] Date of Birth: 04-11-1971  Subjective/Objective:     GI Bleed, Hypotension               Action/Plan: Discharge Planning: NCM spoke to pt at bedside. No NCM needs identified.   PCP LONG, ASHLEY MD  Expected Discharge Date:  02/09/17               Expected Discharge Plan:  Home/Self Care  In-House Referral:  NA  Discharge planning Services  NA  Post Acute Care Choice:  NA Choice offered to:  NA  DME Arranged:  N/A DME Agency:  NA  HH Arranged:  NA HH Agency:  NA  Status of Service:  Completed, signed off  If discussed at [place] of Stay Meetings, dates discussed:    Additional Comments:  Erenest Rasher, RN 02/09/2017, 3:34 PM

## 2017-02-09 NOTE — Progress Notes (Signed)
Family Medicine Teaching Service Daily Progress Note Intern Pager: [phone]  Patient name: LACOLE KOMOROWSKI Medical record number: [id number] Date of birth: Dec 27, 1970 Age: 46 y.o. Gender: female  Primary Care Provider: Blair Heys, PA-C Consultants: Gastroenterology Code Status: Full   Pt Overview and Major Events to Date:  6/21 admitted for hematochezia  Assessment and Plan: FARRAN AMSDEN is a 46 y.o. female presenting with several episodes of BRBPR since this afternoon. PMH is significant for allergy-induced asthma, chronic low back pain s/p L5-S1 fusion in 2008, diverticulitis, hypothyroidism, endometriosis, GERD, and prolactinoma.   GI bleed, improved: no further bloody BMs this morning, only minimal blood with wiping. Hgb stable at 8.7. EGD negative for source of bleeding, few gastric polyps. - po protonix - daily CBC  - Gastroenterology following, appreciate recommendations  - Resume regular diet  - Awaiting pathology results  - Monitor stools and Hgb, if bleeding/Hgb drops further, colonoscopy  L forearm tingling. Likely 2/2 infiltrated IV. Had numbness with tapping over medial epicondyle which is resolved and mild swelling over lateral epicondyle with palpable cord. No erythema. - L UE ultrasound to rule out clot  Hypotension, improved: BP 108/62 with MAP 77, asymptomatic today - Continue to monitor.  UTI, improving. Symptomatic with dysuria and frequency. UA showing moderate leukocytes, many bacteria but negative for nitrates.  - keflex 500mg  BID (6/26-)for 5 days total  Abdominal pain, improving: H/o diverticulitis. Current discomfort feels similar to past episodes but no leukocytosis   - Continue to monitor - Tramadol 50 mg q6h prn  GERD: Has been on aciphex - IV protonix in setting of acute GI bleed  Allergies and sore throat, resolved: Recently started on breo inhaler but requests not to continue while hospitalized, as thinks it has contributed to recent sore  throat.  Rapid strep neg. - Continue home singulair and flonase  Chronic low back pain: Follows with a pain management clinic. Has lidocaine patches and norco as home medications, though last dose of the latter was 2 weeks ago. - Continue home lidoderm patch  Persistent cough, improving: Recent CXR performed for persistent cough and was negative. In setting of nasal congestion suspect post-nasal drip.  - Continue home flonase and prn chloraseptic spray  Hypothyroidism: - Continue home synthroid 50 mcg  Prolactinoma: Now asymptomatic. Says she recently stopped treatment for this. Last MRI 01/03/17 showed no pituitary mass.   FEN/GI: Regular diet. IV protonix  Prophylaxis: SCDs  Disposition: pending LUE doppler  Subjective:  States feels well this morning. No HA, dysuria/ urinary frequency. No bloody Bms, only minimal blood with wiping. L forearm tingling is also improved but can still feel palpable vessel in her arm. Otherwise no concerns.  Objective: Temp:  [97.8 F (36.6 C)-99.6 F (37.6 C)] 97.8 F (36.6 C) (06/27 0334) Pulse Rate:  [76-118] 78 (06/27 0334) Resp:  [14-36] 18 (06/27 0334) BP: (96-116)/(57-74) 97/69 (06/27 0334) SpO2:  [99 %-100 %] 100 % (06/27 0334) Physical Exam: General: Sitting up in chair. in NAD Cardiovascular: RRR, no murmurs Respiratory: CTAB, normal effort on room air Abdomen: soft, nontender, nondistended, + bs Extremities: no LE edema. L UE with palpable cord in antecubital fossa without erythema or swelling. Skin: warm and dry. No pallor  Laboratory:  Recent Labs Lab 02/07/17 1759 02/08/17 0815 02/09/17 0332  WBC 9.2 9.9 10.5  HGB 8.7* 8.6* 8.7*  HCT 27.8* 27.4* 27.5*  PLT 327 343 245    Recent Labs Lab 02/03/17 1959  02/07/17 0657 02/08/17 0331 02/09/17  0332  NA 138  < > 140 140 139  K 3.8  < > 3.5 3.4* 4.0  CL 109  < > 108 110 111  CO2 22  < > 26 24 20*  BUN 18  < > 6 5* 10  CREATININE 0.73  < > 0.72 0.84 0.76  CALCIUM  8.5*  < > 8.7* 8.7* 8.9  PROT 5.9*  --   --   --   --   BILITOT 0.4  --   --   --   --   ALKPHOS 55  --   --   --   --   ALT 17  --   --   --   --   AST 18  --   --   --   --   GLUCOSE 110*  < > 99 108* 120*  < > = values in this interval not displayed.  Imaging/Diagnostic Tests: No results found.   Bufford Lope, DO 02/09/2017, 7:08 AM PGY-1, [place] Intern pager: [phone], text pages welcome

## 2017-02-09 NOTE — Progress Notes (Addendum)
*  Preliminary Results* Left upper extremity venous duplex completed. Left upper extremity is negative for deep vein thrombosis. There is evidence of acute superficial vein thrombosis involving a small segment of the left cephalic vein just below the antecubital fossa.  Preliminary results discussed with Angie Fava, RN.  02/09/2017 12:03 PM  Maudry Mayhew, BS, RVT, RDCS, RDMS

## 2017-03-08 ENCOUNTER — Other Ambulatory Visit: Payer: Self-pay | Admitting: Family Medicine

## 2017-03-08 NOTE — Telephone Encounter (Signed)
Patient is not a patient at [facility], she follows with [facility].

## 2017-03-10 ENCOUNTER — Other Ambulatory Visit: Payer: Self-pay | Admitting: Family Medicine

## 2017-03-28 ENCOUNTER — Other Ambulatory Visit: Payer: Self-pay | Admitting: Family Medicine

## 2017-09-12 DIAGNOSIS — F411 Generalized anxiety disorder: Secondary | ICD-10-CM | POA: Diagnosis present

## 2018-02-06 ENCOUNTER — Emergency Department (HOSPITAL_COMMUNITY): Payer: BC Managed Care – PPO

## 2018-02-06 ENCOUNTER — Other Ambulatory Visit: Payer: Self-pay

## 2018-02-06 ENCOUNTER — Encounter (HOSPITAL_COMMUNITY): Payer: Self-pay | Admitting: Radiology

## 2018-02-06 ENCOUNTER — Emergency Department (HOSPITAL_COMMUNITY)
Admission: EM | Admit: 2018-02-06 | Discharge: 2018-02-06 | Disposition: A | Discharge status: Home / Self Care | Payer: BC Managed Care – PPO | Attending: Emergency Medicine | Admitting: Emergency Medicine

## 2018-02-06 DIAGNOSIS — R1032 Left lower quadrant pain: Secondary | ICD-10-CM | POA: Diagnosis present

## 2018-02-06 DIAGNOSIS — R109 Unspecified abdominal pain: Secondary | ICD-10-CM

## 2018-02-06 DIAGNOSIS — Z79899 Other long term (current) drug therapy: Secondary | ICD-10-CM | POA: Insufficient documentation

## 2018-02-06 DIAGNOSIS — N281 Cyst of kidney, acquired: Secondary | ICD-10-CM | POA: Diagnosis not present

## 2018-02-06 DIAGNOSIS — K625 Hemorrhage of anus and rectum: Secondary | ICD-10-CM

## 2018-02-06 LAB — COMPREHENSIVE METABOLIC PANEL
ALK PHOS: 56 U/L (ref 38–126)
ALT: 31 U/L (ref 14–54)
ANION GAP: 8 (ref 5–15)
AST: 22 U/L (ref 15–41)
Albumin: 3.9 g/dL (ref 3.5–5.0)
BILIRUBIN TOTAL: 0.3 mg/dL (ref 0.3–1.2)
BUN: 19 mg/dL (ref 6–20)
CALCIUM: 9.2 mg/dL (ref 8.9–10.3)
CO2: 26 mmol/L (ref 22–32)
CREATININE: 0.71 mg/dL (ref 0.44–1.00)
Chloride: 110 mmol/L (ref 101–111)
Glucose, Bld: 104 mg/dL — ABNORMAL HIGH (ref 65–99)
Potassium: 3.5 mmol/L (ref 3.5–5.1)
Sodium: 144 mmol/L (ref 135–145)
TOTAL PROTEIN: 7.7 g/dL (ref 6.5–8.1)

## 2018-02-06 LAB — CBC WITH DIFFERENTIAL/PLATELET
BASOS ABS: 0 10*3/uL (ref 0.0–0.1)
BASOS PCT: 0 %
EOS ABS: 0.1 10*3/uL (ref 0.0–0.7)
Eosinophils Relative: 1 %
HEMATOCRIT: 42.2 % (ref 36.0–46.0)
HEMOGLOBIN: 13.5 g/dL (ref 12.0–15.0)
Lymphocytes Relative: 33 %
Lymphs Abs: 2.9 10*3/uL (ref 0.7–4.0)
MCH: 28.3 pg (ref 26.0–34.0)
MCHC: 32 g/dL (ref 30.0–36.0)
MCV: 88.5 fL (ref 78.0–100.0)
Monocytes Absolute: 0.6 10*3/uL (ref 0.1–1.0)
Monocytes Relative: 6 %
NEUTROS ABS: 5.1 10*3/uL (ref 1.7–7.7)
NEUTROS PCT: 60 %
Platelets: 358 10*3/uL (ref 150–400)
RBC: 4.77 MIL/uL (ref 3.87–5.11)
RDW: 14.5 % (ref 11.5–15.5)
WBC: 8.6 10*3/uL (ref 4.0–10.5)

## 2018-02-06 LAB — URINALYSIS, ROUTINE W REFLEX MICROSCOPIC
BILIRUBIN URINE: NEGATIVE
GLUCOSE, UA: NEGATIVE mg/dL
HGB URINE DIPSTICK: NEGATIVE
Ketones, ur: NEGATIVE mg/dL
NITRITE: NEGATIVE
PROTEIN: NEGATIVE mg/dL
SPECIFIC GRAVITY, URINE: 1.021 (ref 1.005–1.030)
pH: 5 (ref 5.0–8.0)

## 2018-02-06 LAB — I-STAT CG4 LACTIC ACID, ED: Lactic Acid, Venous: 0.63 mmol/L (ref 0.5–1.9)

## 2018-02-06 LAB — POC OCCULT BLOOD, ED: Fecal Occult Bld: NEGATIVE

## 2018-02-06 LAB — PREGNANCY, URINE: Preg Test, Ur: NEGATIVE

## 2018-02-06 MED ORDER — ONDANSETRON 4 MG PO TBDP
4.0000 mg | ORAL_TABLET | Freq: Once | ORAL | Status: AC
  Administered 2018-02-06: 4 mg via ORAL
  Filled 2018-02-06: qty 1

## 2018-02-06 MED ORDER — SODIUM CHLORIDE 0.9 % IV BOLUS
1000.0000 mL | Freq: Once | INTRAVENOUS | Status: AC
  Administered 2018-02-06: 1000 mL via INTRAVENOUS

## 2018-02-06 MED ORDER — ACETAMINOPHEN 325 MG PO TABS
650.0000 mg | ORAL_TABLET | Freq: Once | ORAL | Status: AC
Start: 2018-02-06 — End: 2018-02-06
  Administered 2018-02-06: 650 mg via ORAL
  Filled 2018-02-06: qty 2

## 2018-02-06 MED ORDER — IOPAMIDOL (ISOVUE-300) INJECTION 61%
100.0000 mL | Freq: Once | INTRAVENOUS | Status: AC | PRN
  Administered 2018-02-06: 100 mL via INTRAVENOUS

## 2018-02-06 MED ORDER — IOPAMIDOL (ISOVUE-300) INJECTION 61%
INTRAVENOUS | Status: AC
  Filled 2018-02-06: qty 100

## 2018-02-06 MED ORDER — METOCLOPRAMIDE HCL 5 MG PO TABS: 5.0000 mg | ORAL_TABLET | Freq: Four times a day (QID) | ORAL | 0 refills | Status: DC

## 2018-02-06 NOTE — ED Triage Notes (Signed)
Patient states she was started on cipro/flayl for diverticulitis on 6/14. Woke up today with L side and lower back pain. Also noted bright red blood in stool this AM. Took zofran last night for nausea and tylenol for pain.

## 2018-02-06 NOTE — Discharge Instructions (Addendum)
Please see the information and instructions below regarding your visit.  Your diagnoses today include:  1. Left lower quadrant pain   2. Left flank pain   3. Rectal bleeding   4. Renal cyst     Tests performed today include: See side panel of your discharge paperwork for testing performed today. Vital signs are listed at the bottom of these instructions.   Your CT scan is very reassuring today.  It shows no evidence of abscess in the area where you had diverticulitis.  You did have an enlargement of the renal cyst that she previously knew about.  Please follow-up with your primary doctor regarding this finding.  Medications prescribed:    Take any prescribed medications only as prescribed, and any over the counter medications only as directed on the packaging.  Please take Reglan with 25 mg of Benadryl.  Please take the Reglan as needed for nausea vomiting.  Do not combine with Zofran.    Home care instructions:  Please follow any educational materials contained in this packet.   Follow-up instructions: Please follow-up with your primary care provider in one week for further evaluation of your symptoms if they are not completely improved.   Please follow up with [facility] per your previously scheduled appointment.   Return instructions:  Please return to the Emergency Department if you experience worsening symptoms.  Return to the emergency department if you develop any worsening abdominal pain, vomiting with fevers, or nausea or vomiting that prevents you from keeping anything down. Please return if you have any other emergent concerns.  Additional Information:   Your vital signs today were: BP 118/66 (BP Location: Left Arm)    Pulse 75    Temp 98.1 F (36.7 C) (Oral)    Resp 18    Ht 5\' 1"  (1.549 m)    Wt 85.7 kg (189 lb)    LMP 08/24/2011 Comment: negative urine pregnancy test 02-06-2018   SpO2 99%    BMI 35.71 kg/m  If your blood pressure (BP) was elevated on  multiple readings during this visit above 130 for the top number or above 80 for the bottom number, please have this repeated by your primary care provider within one month. --------------  Thank you for allowing [place] to participate in your care today.

## 2018-02-06 NOTE — ED Provider Notes (Signed)
[facility] DEPT Provider Note   CSN: [id number] Arrival date & time: 02/06/18  9169     History   Chief Complaint No chief complaint on file.   HPI Breanna [name] is a 47 y.o. female.  HPI   Patient is a 47 year old female with a history of diverticulosis with diverticulitis, hyperlipidemia, anxiety, depression presenting for left lower quadrant pain and flank pain.  Patient reports that she was diagnosed as an outpatient with diverticulitis on 01-27-2018, and treated with ciprofloxacin and Flagyl for 10 days.  Patient reports that  she has had more nausea than is typical for her course of diverticulitis, however her diarrhea has improved over the past 10 days.  Patient reports that this morning, she had fairly sudden onset, aching left lower quadrant and left flank pain with an episode of blood on the toilet paper when she wiped this morning.  Patient otherwise denies any dysuria, urgency, frequency, fevers, chills, intractable nausea or vomiting.  Patient also notes she has not menstruated in 3 years.  Past Medical History:  Diagnosis Date  . Anxiety   . Back pain   . Depressed   . Diverticulitis   . Hyperlipemia     Patient Active Problem List   Diagnosis Date Noted  . Cystitis   . Upper GI bleed   . Gastroesophageal reflux disease   . Near syncope   . Abdominal pain   . Chronic bilateral low back pain   . Lower GI bleed   . Hematochezia 02/03/2017    Past Surgical History:  Procedure Laterality Date  . ESOPHAGOGASTRODUODENOSCOPY (EGD) WITH PROPOFOL N/A 02/08/2017   Procedure: ESOPHAGOGASTRODUODENOSCOPY (EGD) WITH PROPOFOL;  Surgeon: Teena Irani, MD;  Location: [facility];  Service: Endoscopy;  Laterality: N/A;     OB History   None      Home Medications    Prior to Admission medications   Medication Sig Start Date End Date Taking? Authorizing Provider  azelastine (OPTIVAR) 0.05 % ophthalmic solution Place 1 drop into both  eyes daily. 01/21/17   [provider]  BREO ELLIPTA 200-25 MCG/INH AEPB Inhale 1 puff into the lungs daily. 01/21/17   [provider]  EPIPEN 2-PAK 0.3 MG/0.3ML SOAJ injection Inject 1 Dose into the muscle See admin instructions. For allergic reaction 01/21/17   [provider]  fluticasone (FLONASE) 50 MCG/ACT nasal spray Place 1 spray into both nostrils daily. 01/08/17   [provider]  HYDROcodone-acetaminophen (NORCO/VICODIN) 5-325 MG tablet Take 1 tablet by mouth every 6 (six) hours as needed for moderate pain.    [provider]  ibuprofen (ADVIL,MOTRIN) 200 MG tablet Take 400-800 mg by mouth See admin instructions. Takes 400 mg to 800 mg as needed for pain    [provider]  levothyroxine (SYNTHROID, LEVOTHROID) 50 MCG tablet Take 50 mcg by mouth every morning. 12/29/16   [provider]  lidocaine (LIDODERM) 5 % Place 1 patch onto the skin as needed for pain. 10/29/14   [provider]  loratadine (CLARITIN) 10 MG tablet Take 1 tablet (10 mg total) by mouth daily. 02/10/17   Bufford Lope, DO  Melatonin 10 MG TABS Take 10 mg by mouth at bedtime.    [provider]  montelukast (SINGULAIR) 10 MG tablet Take 10 mg by mouth daily. 01/21/17   [provider]  Multiple Vitamin (MULITIVITAMIN WITH MINERALS) TABS Take 1 tablet by mouth daily.    [provider]  pantoprazole ([place])  40 MG tablet Take 1 tablet (40 mg total) by mouth daily. 02/10/17   Bufford Lope, DO  simvastatin (ZOCOR) 40 MG tablet Take 40 mg by mouth every evening.    [provider]  topiramate (TOPAMAX) 50 MG tablet Take 75 mg by mouth 2 (two) times daily. 01/28/17   [provider]    Family History No family history on file.  Social History Social History   Tobacco Use  . Smoking status: Never Smoker  . Smokeless tobacco: Never Used  Substance Use Topics  . Alcohol use: No  . Drug use: No     Allergies     Mold extract [trichophyton]   Review of Systems Review of Systems  Constitutional: Negative for chills and fever.  Gastrointestinal: Positive for abdominal pain, blood in stool, diarrhea and nausea. Negative for vomiting.  Genitourinary: Positive for flank pain. Negative for dysuria, frequency and urgency.  All other systems reviewed and are negative.    Physical Exam Updated Vital Signs BP 130/75 (BP Location: Left Arm)   Pulse 74   Temp 98.1 F (36.7 C) (Oral)   Resp 18   Ht 5\' 1"  (1.549 m)   Wt 85.7 kg (189 lb)   LMP 08/24/2011   SpO2 99%   BMI 35.71 kg/m   Physical Exam  Constitutional: She appears well-developed and well-nourished. No distress.  HENT:  Head: Normocephalic and atraumatic.  Mouth/Throat: Oropharynx is clear and moist.  Eyes: Pupils are equal, round, and reactive to light. Conjunctivae and EOM are normal.  Neck: Normal range of motion. Neck supple.  Cardiovascular: Normal rate, regular rhythm, S1 normal and S2 normal.  No murmur heard. Pulmonary/Chest: Effort normal and breath sounds normal. She has no wheezes. She has no rales.  Abdominal: Soft. She exhibits no distension. There is tenderness. There is no guarding.  Tenderness palpation of left lower quadrant extending up to the side without guarding or rebound.  No CVA tenderness.  Genitourinary:  Genitourinary Comments: Rectal examination performed with RN chaperone present.  Patient has a nonthrombosed external hemorrhoid.  Normal rectal tone.  There are no masses in rectal vault.  Internal hemorrhoids noted.  No fissures.  Small amount of brown stool in rectal vault.  Musculoskeletal: Normal range of motion. She exhibits no edema or deformity.  Lymphadenopathy:    She has no cervical adenopathy.  Neurological: She is alert.  Cranial nerves grossly intact. Patient moves extremities symmetrically and with good coordination.  Skin: Skin is warm and dry. No rash noted. No erythema.  Psychiatric:  She has a normal mood and affect. Her behavior is normal. Judgment and thought content normal.  Nursing note and vitals reviewed.    ED Treatments / Results  Labs (all labs ordered are listed, but only abnormal results are displayed) Labs Reviewed  COMPREHENSIVE METABOLIC PANEL - Abnormal; Notable for the following components:      Result Value   Glucose, Bld 104 (*)    All other components within normal limits  URINALYSIS, ROUTINE W REFLEX MICROSCOPIC - Abnormal; Notable for the following components:   Leukocytes, UA MODERATE (*)    Bacteria, UA RARE (*)    All other components within normal limits  URINE CULTURE  CBC WITH DIFFERENTIAL/PLATELET  PREGNANCY, URINE  I-STAT CG4 LACTIC ACID, ED  POC OCCULT BLOOD, ED    EKG None  Radiology Ct Abdomen Pelvis W Contrast  Result Date: 02/06/2018 CLINICAL DATA:  Left-sided abdominal pain. Bloody stool. History of diverticulitis. EXAM:  CT ABDOMEN AND PELVIS WITH CONTRAST TECHNIQUE: Multidetector CT imaging of the abdomen and pelvis was performed using the standard protocol following bolus administration of intravenous contrast. CONTRAST:  167mL ISOVUE-300 IOPAMIDOL (ISOVUE-300) INJECTION 61% COMPARISON:  CT scan of May 06, 2010. FINDINGS: Lower chest: No acute abnormality. Hepatobiliary: No focal liver abnormality is seen. No gallstones, gallbladder wall thickening, or biliary dilatation. Pancreas: Unremarkable. No pancreatic ductal dilatation or surrounding inflammatory changes. Spleen: Normal in size without focal abnormality. Adrenals/Urinary Tract: Adrenal glands appear normal. No renal or ureteral calculi are noted. Large parapelvic cyst is noted on the right. No hydronephrosis or renal obstruction is noted. Urinary bladder is unremarkable. Stomach/Bowel: Stomach is within normal limits. Appendix appears normal. No evidence of bowel wall thickening, distention, or inflammatory changes. Sigmoid diverticulosis is noted without  inflammation. Vascular/Lymphatic: No significant vascular findings are present. No enlarged abdominal or pelvic lymph nodes. Reproductive: Uterus and bilateral adnexa are unremarkable. Other: No abdominal wall hernia or abnormality. No abdominopelvic ascites. Musculoskeletal: No acute or significant osseous findings. IMPRESSION: Sigmoid diverticulosis without inflammation. No acute abnormality seen in the abdomen or pelvis. Electronically Signed   By: Marijo Conception, M.D.   On: 02/06/2018 10:57    Procedures Procedures (including critical care time)  Medications Ordered in ED Medications  sodium chloride 0.9 % bolus 1,000 mL (1,000 mLs Intravenous New Bag/Given 02/06/18 0846)  acetaminophen (TYLENOL) tablet 650 mg (650 mg Oral Given 02/06/18 0848)  ondansetron (ZOFRAN-ODT) disintegrating tablet 4 mg (4 mg Oral Given 02/06/18 0848)     Initial Impression / Assessment and Plan / ED Course  I have reviewed the triage vital signs and the nursing notes.  Pertinent labs & imaging results that were available during my care of the patient were reviewed by me and considered in my medical decision making (see chart for details).  Clinical Course as of Feb 06 1246  Mon Feb 06, 2018  0845 Differential diagnosis includes diverticulitis with/without abscess, pyelonephritis, ischemic colitis.   [AM]    Clinical Course User Index [AM] Albesa Seen, PA-C    Patient is nontoxic-appearing, afebrile, and in no acute distress.  Patient left lower quadrant left flank pain.  Due to concern about abscess formation in the setting of treatment for diverticulitis, obtain CT abdomen and pelvis with contrast.  Patient is at low risk for ischemic colitis given that she is a non-smoker, and young for this pathology.  CT abdomen and pelvis demonstrated interval enlargement of a right renal cyst, but otherwise no acute abdominal process.  Hemoccult is negative.  Hemoglobin is stable.  Patient exhibits no leukocytosis.   Patient does have moderate leukocytes, which appear to be chronic for her, but she is not having urinary symptoms.  Urine culture obtained and patient instructed that she will be informed of results.  Doubt urinary pathology given that she has recently been on ciprofloxacin.  Patient has an upcoming appointment with her gastroenterologist, and I discussed maintaining this follow-up in order to discuss her presentation today.  I also instructed patient to follow-up with her primary care provider regarding the interim enlargement of her renal cyst.  Patient can return precautions for any worsening abdominal pain, abdominal pain with fevers, or intractable nausea or vomiting.  Discussed switching to Reglan given that she is finishing QT prolonging medication.  Patient is in understanding and agrees with the plan of care.  Final Clinical Impressions(s) / ED Diagnoses   Final diagnoses:  Left lower quadrant pain  Left flank  pain  Rectal bleeding    ED Discharge Orders        Ordered    metoCLOPramide (REGLAN) 5 MG tablet  Every 6 hours     02/06/18 1252       Tamala Julian 02/06/18 1255    Little, Wenda Overland, MD 02/07/18 0800

## 2018-02-06 NOTE — ED Notes (Signed)
Lab called to add on urine

## 2018-02-07 LAB — URINE CULTURE

## 2018-10-02 DIAGNOSIS — J4520 Mild intermittent asthma, uncomplicated: Secondary | ICD-10-CM | POA: Insufficient documentation

## 2018-10-22 IMAGING — MR MR HEAD WO/W CM
15 of 19 series · 35 of 48 positions shown · IV contrast (multihance)
Comparison: MRI of the brain dated 12/01/2014, 04/04/2014,
07/30/2006.

CLINICAL DATA: 45 y/o F; history of pituitary adenoma diagnosed in
6451 with elevated prolactin, blurred vision, and headaches. No
surgery.

EXAM:
MRI HEAD WITHOUT AND WITH CONTRAST
TECHNIQUE: Multiplanar, multiecho pulse sequences of the brain and surrounding
structures were obtained without and with intravenous contrast.
Pituitary protocol.
CONTRAST:  8mL MULTIHANCE GADOBENATE DIMEGLUMINE 529 MG/ML IV SOLN

[Series 2: T1 · sagittal · 5.0mm · 0.45mm/px · 3 of 19 slices shown]
[im 1/19]
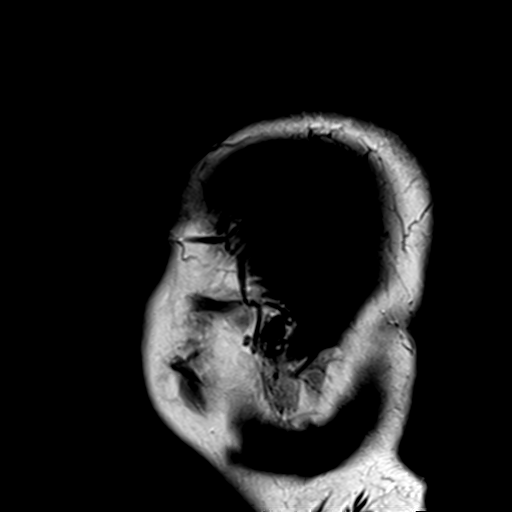
[im 10/19]
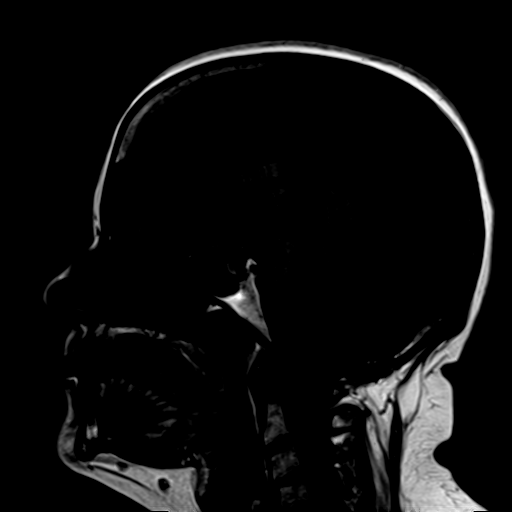
[im 19/19]
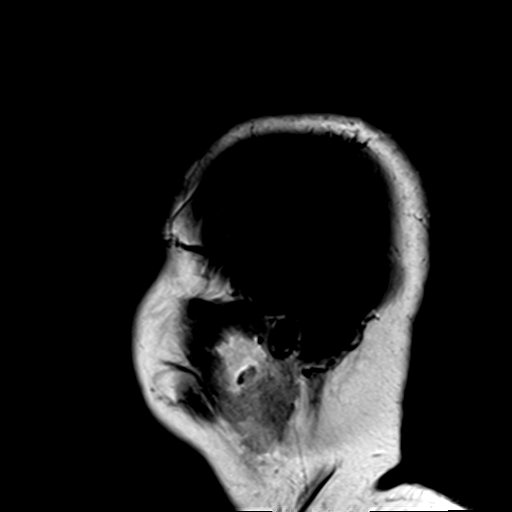

[Series 3: DWI · axial · 3.0mm · 1.80mm/px · z∈[-12,+135]mm · 11 of 100 slices shown]
[im 1/100]
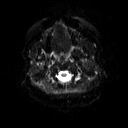
[im 10/100]
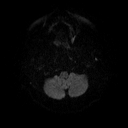
[im 20/100]
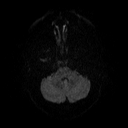
[im 30/100]
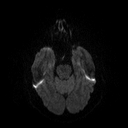
[im 40/100]
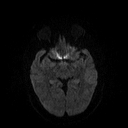
[im 50/100]
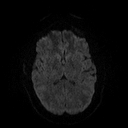
[im 60/100]
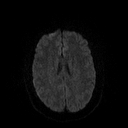
[im 70/100]
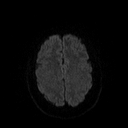
[im 80/100]
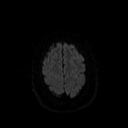
[im 90/100]
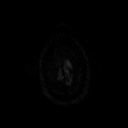
[im 100/100]
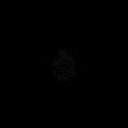

[Series 4: dwi_adc · axial · 3.0mm · 1.80mm/px · z∈[-12,+135]mm · 6 of 50 slices shown]
[im 1/50]
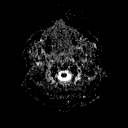
[im 10/50]
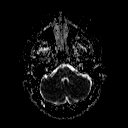
[im 20/50]
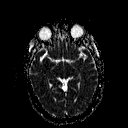
[im 30/50]
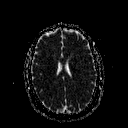
[im 40/50]
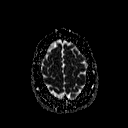
[im 50/50]
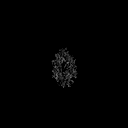

[Series 5: T2 · axial · 5.0mm · 0.36mm/px · z∈[-6,+130]mm · 2 of 22 slices shown]
[im 1/22]
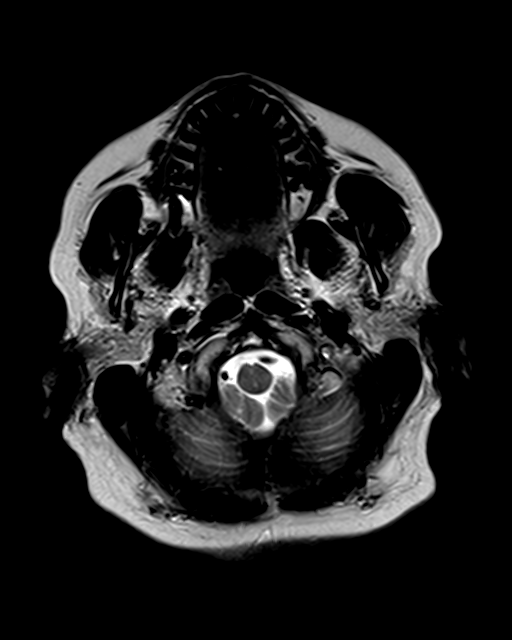
[im 22/22]
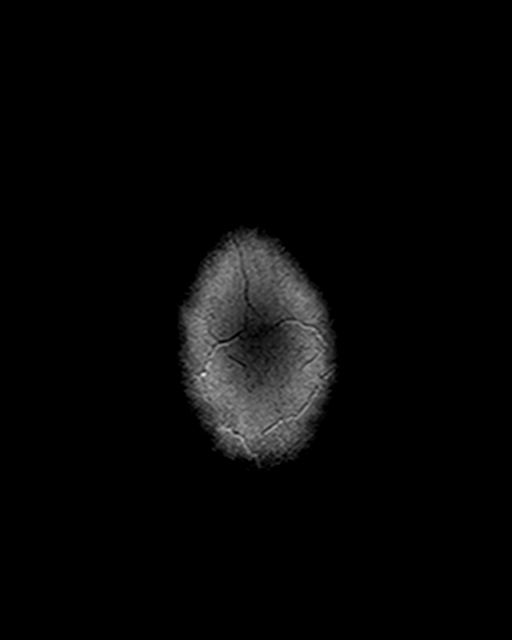

[Series 6: FLAIR · axial · 5.0mm · 0.45mm/px · z∈[-6,+130]mm · 2 of 22 slices shown]
[im 1/22]
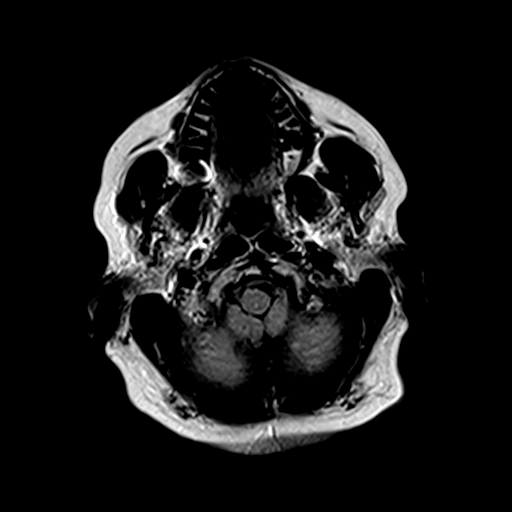
[im 22/22]
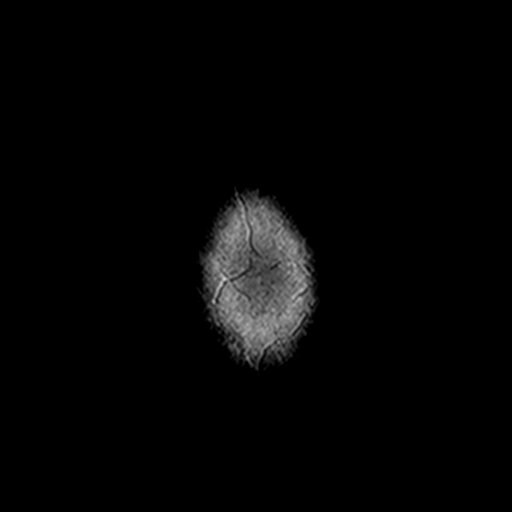

[Series 7: axial grad (blood) · axial · 5.0mm · 0.45mm/px · z∈[-9,+133]mm · 2 of 22 slices shown]
[im 1/22]
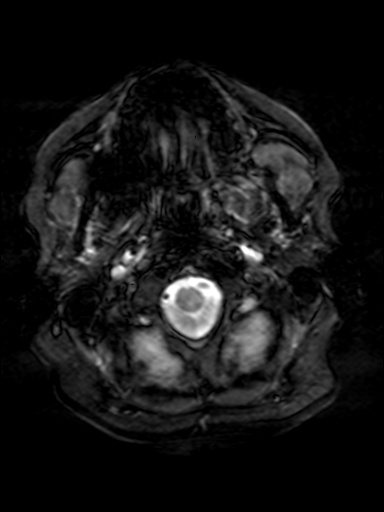
[im 22/22]
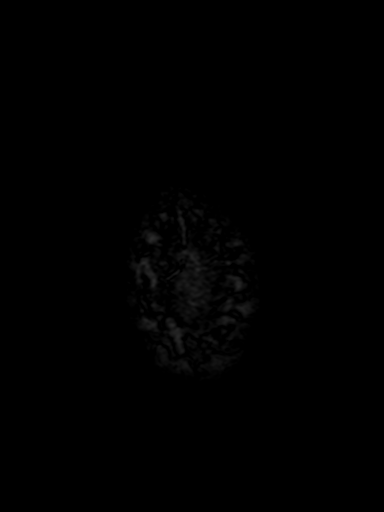

[Series 8: sag 3mm · sagittal · 3.0mm · 0.33mm/px · 1 of 11 slices shown]
[im 1/11]
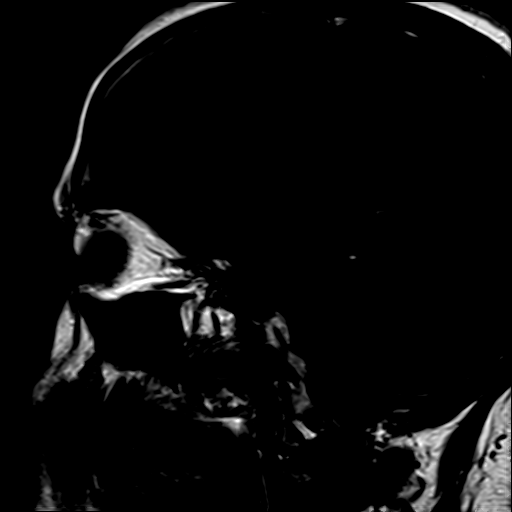

[Series 9: cor 3mm · coronal · 3.0mm · 0.33mm/px · 1 of 11 slices shown]
[im 1/11]
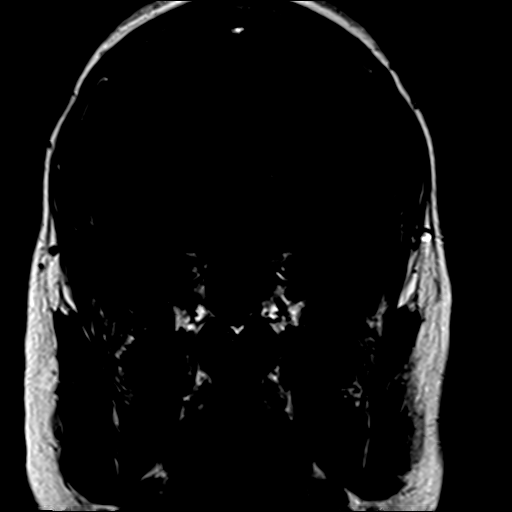

[Series 10: pre cor dynamic · coronal · non-contrast · 3.0mm · 0.35mm/px · 1 of 7 slices shown]
[im 1/7]
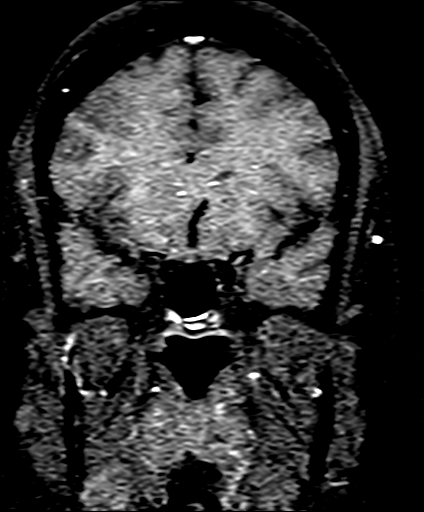

[Series 11: post fs cor · coronal · 3.0mm · 0.35mm/px · 1 of 7 slices shown (1 of 6)]
[im 1/7]
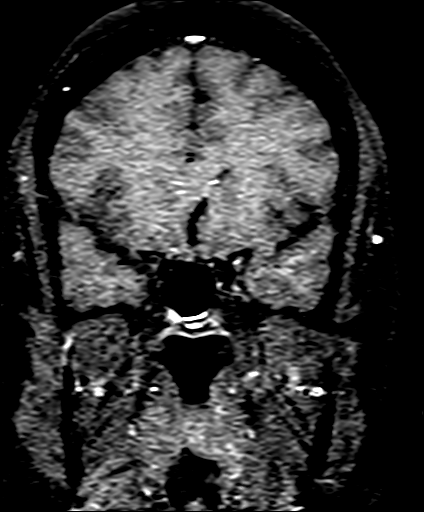

[Series 12: post fs cor · coronal · 3.0mm · 0.35mm/px · 1 of 7 slices shown (2 of 6)]
[im 1/7]
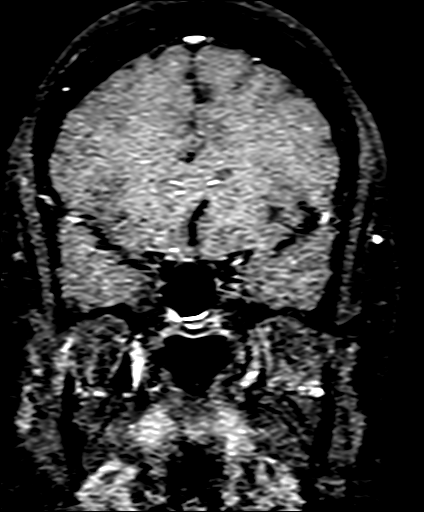

[Series 13: post fs cor · coronal · 3.0mm · 0.35mm/px · 1 of 7 slices shown (3 of 6)]
[im 1/7]
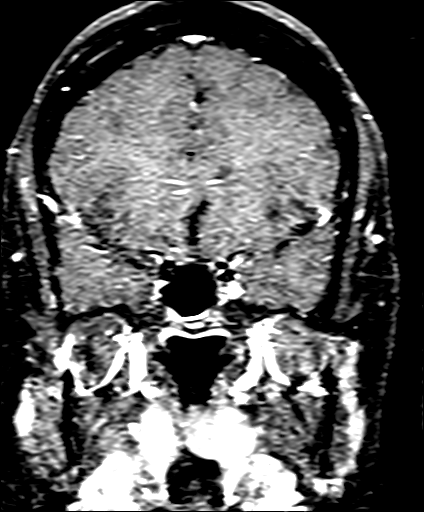

[Series 14: post fs cor · coronal · 3.0mm · 0.35mm/px · 1 of 7 slices shown (4 of 6)]
[im 1/7]
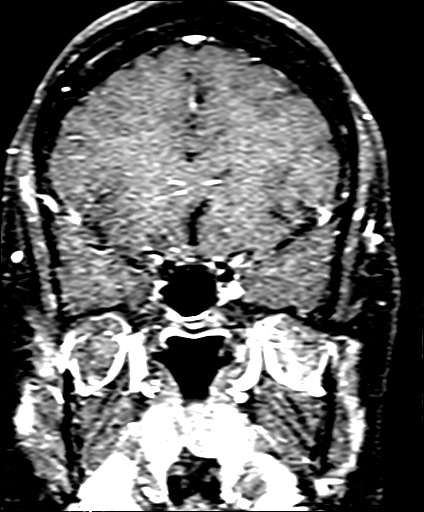

[Series 15: post fs cor · coronal · 3.0mm · 0.35mm/px · 1 of 7 slices shown (5 of 6)]
[im 1/7]
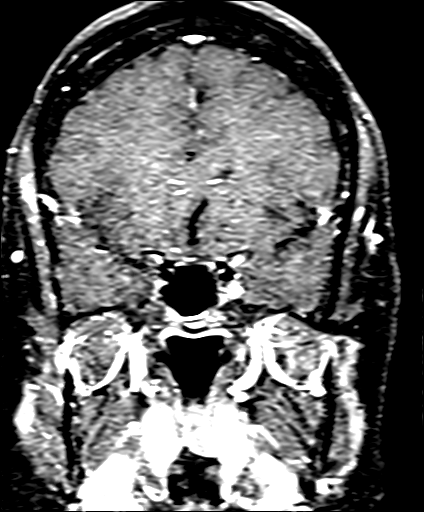

[Series 16: post fs cor · coronal · 3.0mm · 0.35mm/px · 1 of 7 slices shown (6 of 6)]
[im 1/7]
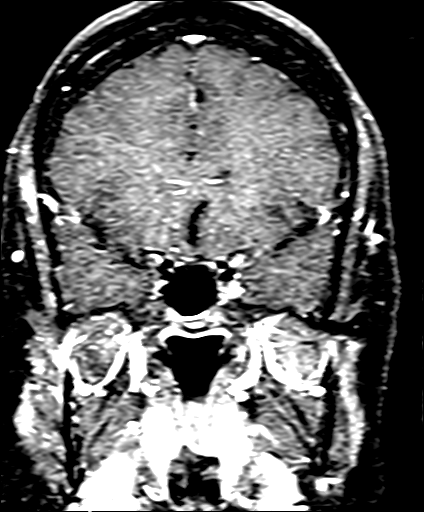

[35 of 48 positions shown; findings below may reference images not displayed]

FINDINGS: Brain: No acute infarction, hemorrhage, hydrocephalus, extra-axial
collection or mass lesion. The pituitary gland is morphologically
normal and no abnormal enhancement is identified on dynamic
postcontrast or delayed postcontrast images. Additionally, there is
no abnormal enhancement of the brain. Previous identified lesion
within the right aspect of the pituitary gland in 3884 is not
appreciated on the current study.

Vascular: Normal flow voids.

Skull and upper cervical spine: Normal marrow signal.

Sinuses/Orbits: Negative.

Other: None.
IMPRESSION: 1. Morphologically normal pituitary gland, stable from prior MRI of
the brain. No pituitary mass is identified.
2. Otherwise stable and normal MRI of the brain with and without
contrast.

By: Najy Tiger M.D.

## 2018-11-14 IMAGING — CR DG CHEST 2V
2 series · 2 of 2 positions shown · non-contrast
Comparison: 08/26/2011 radiographs

CLINICAL DATA: Shortness of breath and cough for 3 days, chest pain
for 1 day.

EXAM:
CHEST  2 VIEW

[w chest pa]
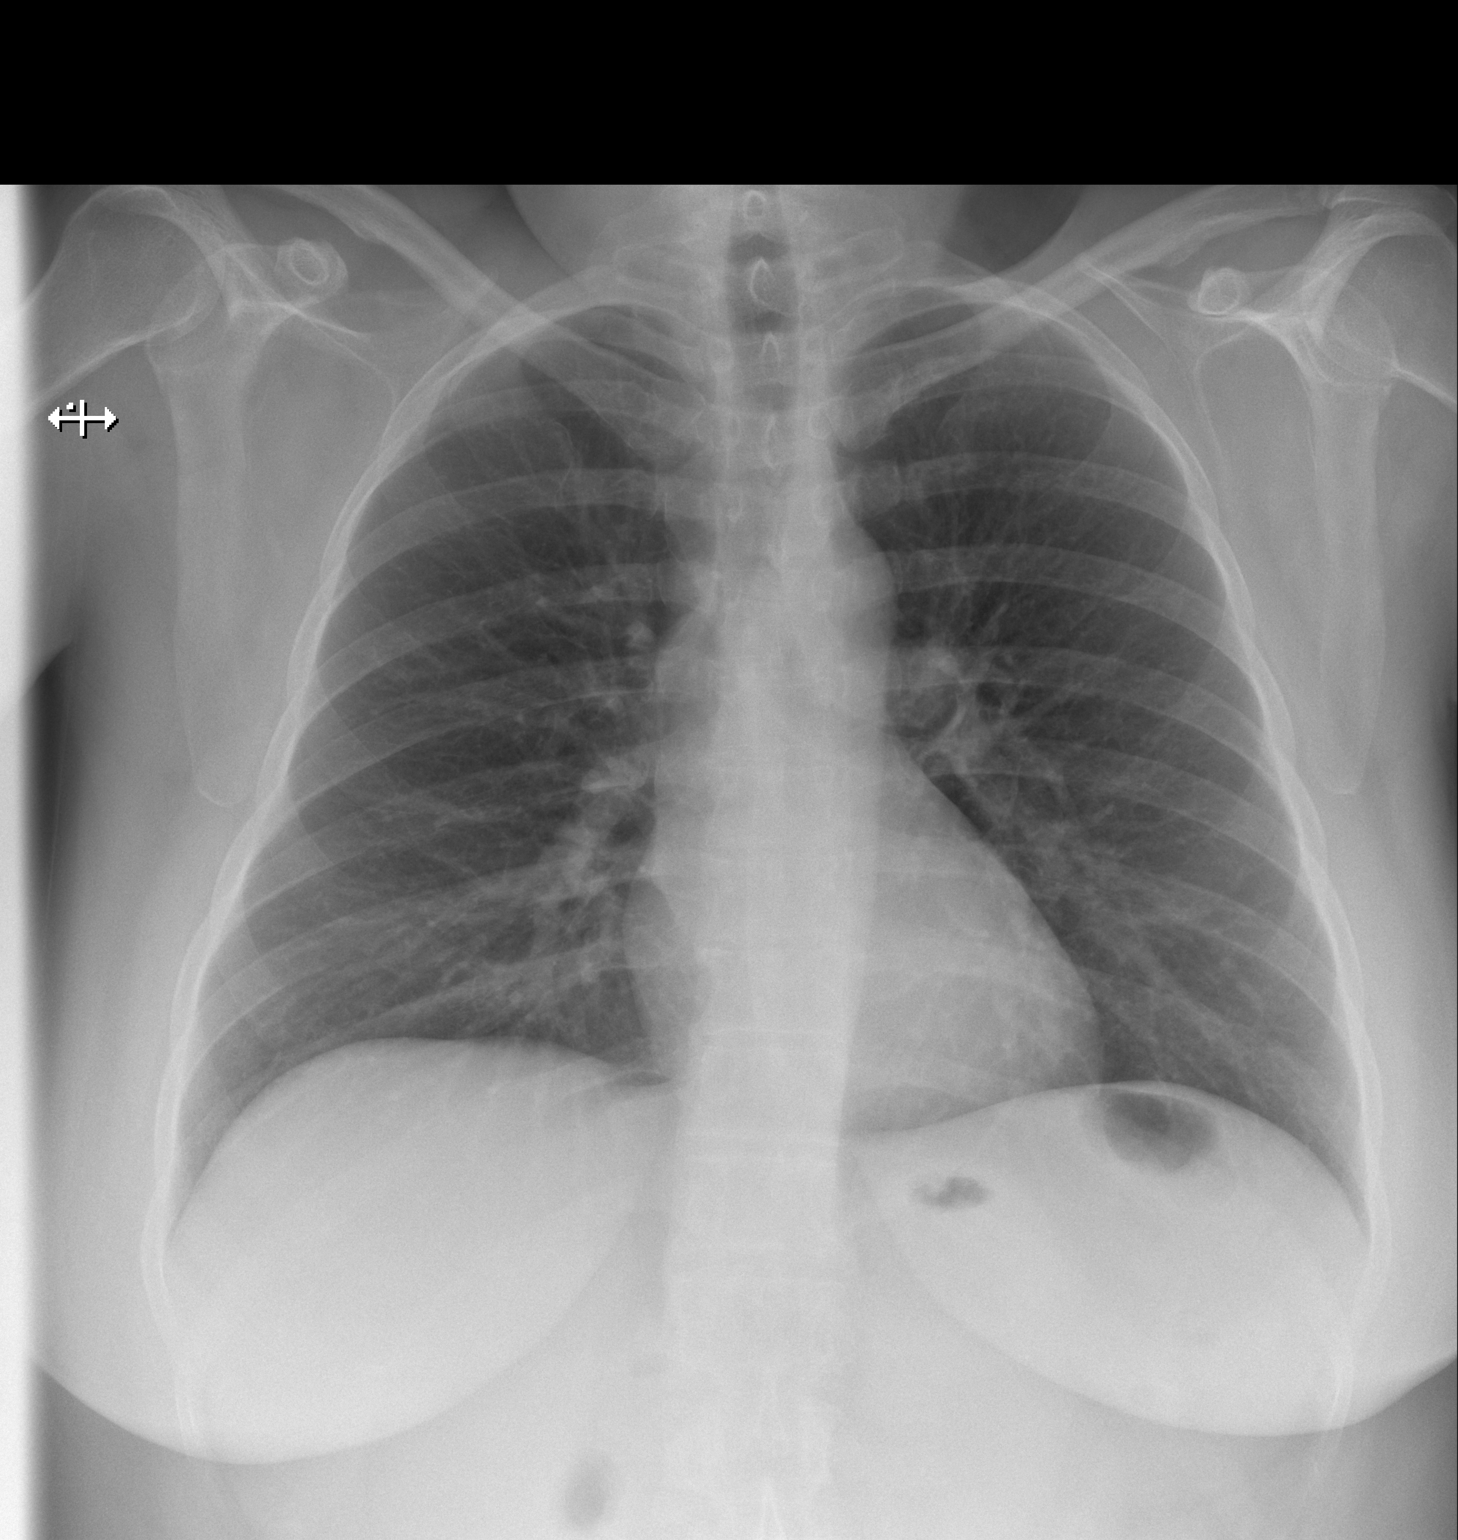

[w chest lat]
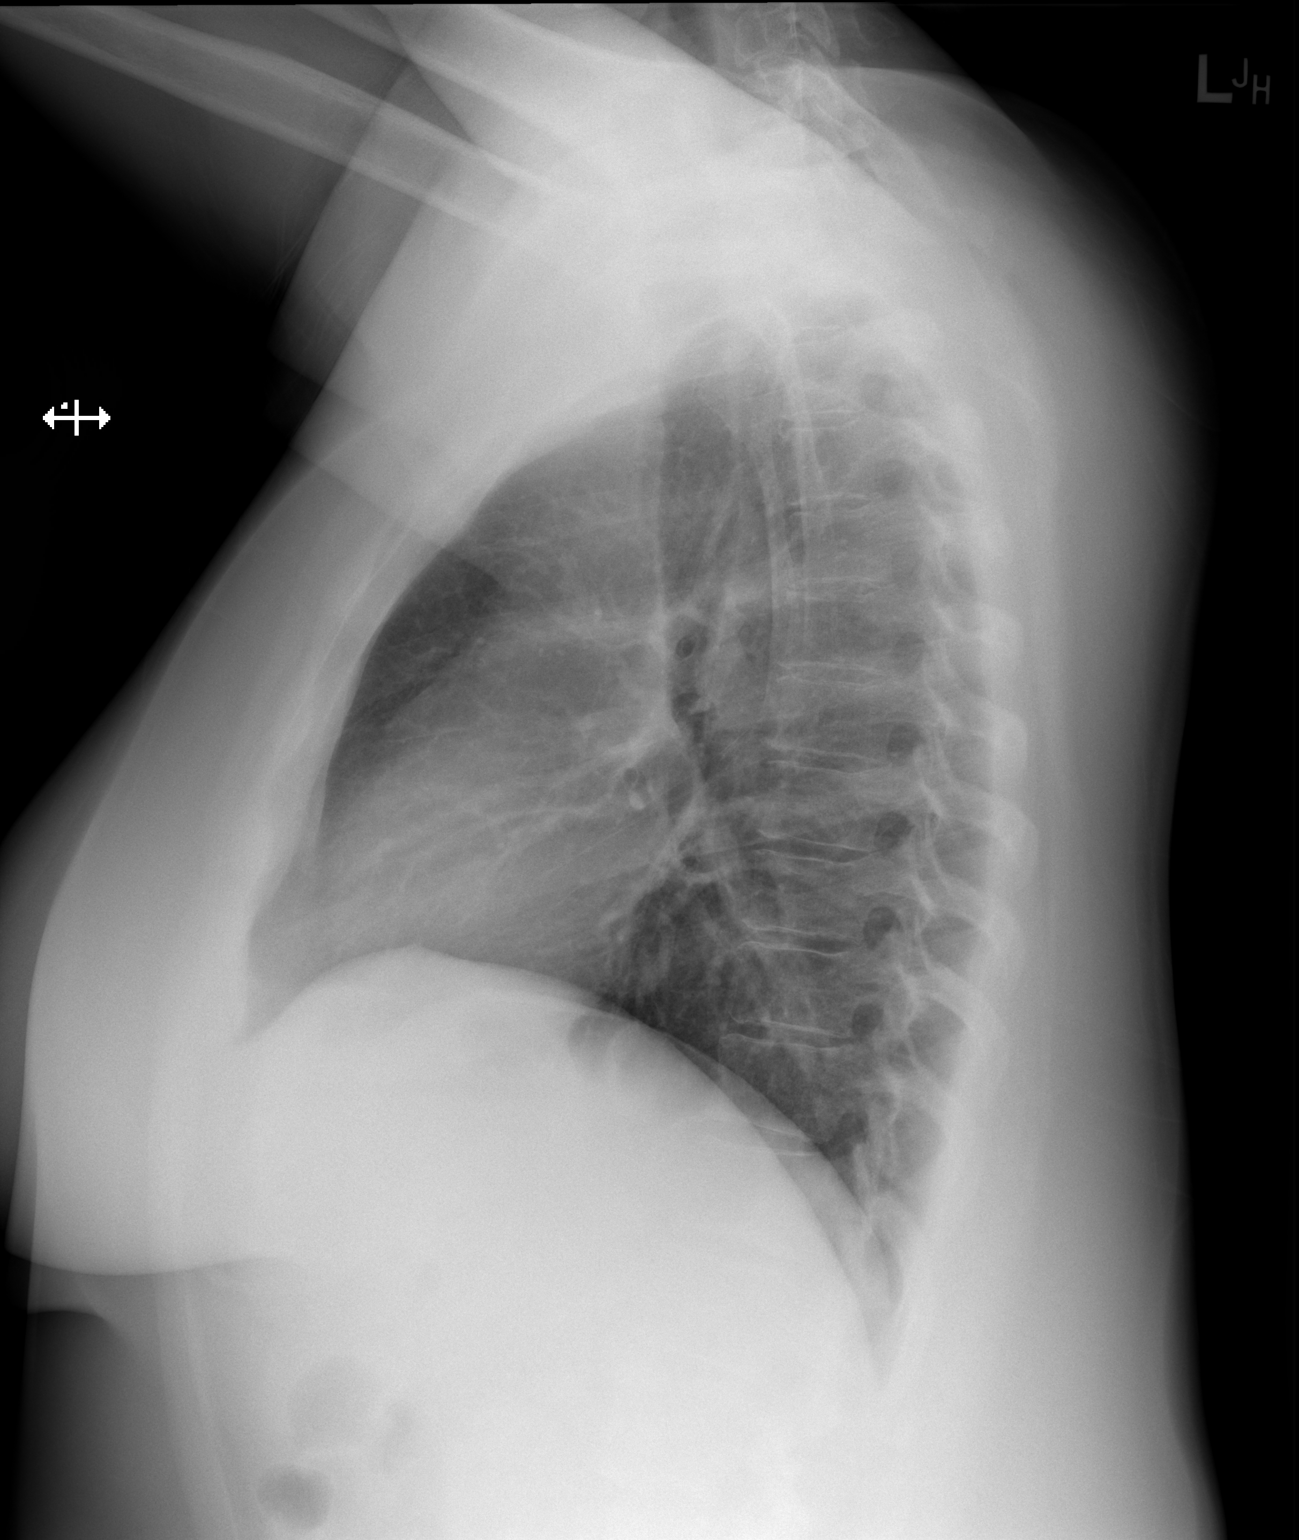

[2 of 2 positions shown; findings below may reference images not displayed]

FINDINGS: The cardiomediastinal silhouette is unremarkable.

There is no evidence of focal airspace disease, pulmonary edema,
suspicious pulmonary nodule/mass, pleural effusion, or pneumothorax.
No acute bony abnormalities are identified.
IMPRESSION: No active cardiopulmonary disease.

## 2019-08-16 ENCOUNTER — Ambulatory Visit: Payer: BC Managed Care – PPO | Attending: Internal Medicine

## 2019-08-16 DIAGNOSIS — Z20822 Contact with and (suspected) exposure to covid-19: Secondary | ICD-10-CM

## 2019-08-18 LAB — NOVEL CORONAVIRUS, NAA: SARS-CoV-2, NAA: DETECTED — AB

## 2019-08-19 ENCOUNTER — Encounter: Payer: Self-pay | Admitting: Nurse Practitioner

## 2019-08-19 ENCOUNTER — Telehealth: Payer: Self-pay | Admitting: Nurse Practitioner

## 2019-08-19 ENCOUNTER — Other Ambulatory Visit: Payer: Self-pay | Admitting: Nurse Practitioner

## 2019-08-19 DIAGNOSIS — U071 COVID-19: Secondary | ICD-10-CM | POA: Insufficient documentation

## 2019-08-19 DIAGNOSIS — Z6836 Body mass index (BMI) 36.0-36.9, adult: Secondary | ICD-10-CM | POA: Insufficient documentation

## 2019-08-19 NOTE — Progress Notes (Signed)
Bam infusion orders for 08/22/2019.

## 2019-08-19 NOTE — Telephone Encounter (Signed)
  I connected by phone with Breanna [name] on 08/19/2019 at 2:28 PM to discuss the potential use of an new treatment for mild to moderate COVID-19 viral infection in non-hospitalized patients. Was exposed and tested 08/16/2019 positive, with subsequent symptoms starting 08/17/2019.  Continues with cough, chills.  BMI > 35.  This patient is a 49 y.o. female that meets the FDA criteria for Emergency Use Authorization of bamlanivimab or casirivimab\imdevimab.  Has a (+) direct SARS-CoV-2 viral test result  Has mild or moderate COVID-19   Is ? 51 years of age and weighs ? 40 kg  Is NOT hospitalized due to COVID-19  Is NOT requiring oxygen therapy or requiring an increase in baseline oxygen flow rate due to COVID-19  Is within 10 days of symptom onset  Has at least one of the high risk factor(s) for progression to severe COVID-19 and/or hospitalization as defined in EUA.  Specific high risk criteria : BMI >/= 35   I have spoken and communicated the following to the patient or parent/caregiver:  1. FDA has authorized the emergency use of bamlanivimab and casirivimab\imdevimab for the treatment of mild to moderate COVID-19 in adults and pediatric patients with positive results of direct SARS-CoV-2 viral testing who are 73 years of age and older weighing at least 40 kg, and who are at high risk for progressing to severe COVID-19 and/or hospitalization.  2. The significant known and potential risks and benefits of bamlanivimab and casirivimab\imdevimab, and the extent to which such potential risks and benefits are unknown.  3. Information on available alternative treatments and the risks and benefits of those alternatives, including clinical trials.  4. Patients treated with bamlanivimab and casirivimab\imdevimab should continue to self-isolate and use infection control measures (e.g., wear mask, isolate, social distance, avoid sharing personal items, clean and disinfect "high touch" surfaces, and  frequent handwashing) according to CDC guidelines.   5. The patient or parent/caregiver has the option to accept or refuse bamlanivimab or casirivimab\imdevimab .  After reviewing this information with the patient, The patient agreed to proceed with receiving the bamlanimivab infusion and will be provided a copy of the Fact sheet prior to receiving the infusion.[name] 08/19/2019 2:28 PM

## 2019-08-22 ENCOUNTER — Ambulatory Visit (HOSPITAL_COMMUNITY): Payer: BC Managed Care – PPO

## 2021-06-26 ENCOUNTER — Other Ambulatory Visit: Payer: Self-pay | Admitting: Urology

## 2021-06-26 DIAGNOSIS — M545 Low back pain, unspecified: Secondary | ICD-10-CM

## 2021-07-25 ENCOUNTER — Ambulatory Visit
Admission: RE | Admit: 2021-07-25 | Discharge: 2021-07-25 | Disposition: A | Discharge status: Home / Self Care | Payer: BC Managed Care – PPO | Source: Ambulatory Visit | Attending: Urology | Admitting: Urology

## 2021-07-25 DIAGNOSIS — M545 Low back pain, unspecified: Secondary | ICD-10-CM

## 2021-07-25 MED ORDER — GADOBENATE DIMEGLUMINE 529 MG/ML IV SOLN
18.0000 mL | Freq: Once | INTRAVENOUS | Status: AC | PRN
  Administered 2021-07-25: 18 mL via INTRAVENOUS

## 2022-02-17 ENCOUNTER — Other Ambulatory Visit (HOSPITAL_COMMUNITY): Payer: Self-pay

## 2022-08-02 ENCOUNTER — Observation Stay (HOSPITAL_COMMUNITY): Payer: BC Managed Care – PPO

## 2022-08-02 ENCOUNTER — Emergency Department (HOSPITAL_COMMUNITY): Payer: BC Managed Care – PPO

## 2022-08-02 ENCOUNTER — Observation Stay (HOSPITAL_COMMUNITY)
Admission: EM | Admit: 2022-08-02 | Discharge: 2022-08-03 | Disposition: A | Discharge status: Home / Self Care | Payer: BC Managed Care – PPO | Attending: Internal Medicine | Admitting: Internal Medicine

## 2022-08-02 ENCOUNTER — Encounter (HOSPITAL_COMMUNITY): Payer: Self-pay

## 2022-08-02 ENCOUNTER — Other Ambulatory Visit: Payer: Self-pay

## 2022-08-02 DIAGNOSIS — R9431 Abnormal electrocardiogram [ECG] [EKG]: Secondary | ICD-10-CM | POA: Diagnosis not present

## 2022-08-02 DIAGNOSIS — Z7952 Long term (current) use of systemic steroids: Secondary | ICD-10-CM | POA: Insufficient documentation

## 2022-08-02 DIAGNOSIS — E876 Hypokalemia: Secondary | ICD-10-CM | POA: Diagnosis not present

## 2022-08-02 DIAGNOSIS — R109 Unspecified abdominal pain: Secondary | ICD-10-CM | POA: Diagnosis not present

## 2022-08-02 DIAGNOSIS — J029 Acute pharyngitis, unspecified: Secondary | ICD-10-CM | POA: Diagnosis not present

## 2022-08-02 DIAGNOSIS — R197 Diarrhea, unspecified: Secondary | ICD-10-CM | POA: Insufficient documentation

## 2022-08-02 DIAGNOSIS — K219 Gastro-esophageal reflux disease without esophagitis: Secondary | ICD-10-CM | POA: Diagnosis present

## 2022-08-02 DIAGNOSIS — R112 Nausea with vomiting, unspecified: Secondary | ICD-10-CM | POA: Insufficient documentation

## 2022-08-02 DIAGNOSIS — R6510 Systemic inflammatory response syndrome (SIRS) of non-infectious origin without acute organ dysfunction: Secondary | ICD-10-CM | POA: Diagnosis present

## 2022-08-02 DIAGNOSIS — Z1152 Encounter for screening for COVID-19: Secondary | ICD-10-CM | POA: Insufficient documentation

## 2022-08-02 DIAGNOSIS — Z79899 Other long term (current) drug therapy: Secondary | ICD-10-CM | POA: Insufficient documentation

## 2022-08-02 DIAGNOSIS — F411 Generalized anxiety disorder: Secondary | ICD-10-CM | POA: Diagnosis present

## 2022-08-02 DIAGNOSIS — R509 Fever, unspecified: Secondary | ICD-10-CM | POA: Diagnosis present

## 2022-08-02 DIAGNOSIS — E039 Hypothyroidism, unspecified: Secondary | ICD-10-CM | POA: Diagnosis not present

## 2022-08-02 LAB — CBC WITH DIFFERENTIAL/PLATELET
Abs Immature Granulocytes: 0.14 10*3/uL — ABNORMAL HIGH (ref 0.00–0.07)
Basophils Absolute: 0.1 10*3/uL (ref 0.0–0.1)
Basophils Relative: 0 %
Eosinophils Absolute: 0 10*3/uL (ref 0.0–0.5)
Eosinophils Relative: 0 %
HCT: 37.8 % (ref 36.0–46.0)
Hemoglobin: 12.2 g/dL (ref 12.0–15.0)
Immature Granulocytes: 1 %
Lymphocytes Relative: 13 %
Lymphs Abs: 2.2 10*3/uL (ref 0.7–4.0)
MCH: 27.5 pg (ref 26.0–34.0)
MCHC: 32.3 g/dL (ref 30.0–36.0)
MCV: 85.3 fL (ref 80.0–100.0)
Monocytes Absolute: 0.9 10*3/uL (ref 0.1–1.0)
Monocytes Relative: 5 %
Neutro Abs: 14.2 10*3/uL — ABNORMAL HIGH (ref 1.7–7.7)
Neutrophils Relative %: 81 %
Platelets: 308 10*3/uL (ref 150–400)
RBC: 4.43 MIL/uL (ref 3.87–5.11)
RDW: 13.4 % (ref 11.5–15.5)
WBC: 17.5 10*3/uL — ABNORMAL HIGH (ref 4.0–10.5)
nRBC: 0 % (ref 0.0–0.2)

## 2022-08-02 LAB — URINALYSIS, ROUTINE W REFLEX MICROSCOPIC
Bacteria, UA: NONE SEEN
Bilirubin Urine: NEGATIVE
Glucose, UA: NEGATIVE mg/dL
Ketones, ur: NEGATIVE mg/dL
Leukocytes,Ua: NEGATIVE
Nitrite: NEGATIVE
Protein, ur: NEGATIVE mg/dL
Specific Gravity, Urine: 1.003 — ABNORMAL LOW (ref 1.005–1.030)
pH: 6 (ref 5.0–8.0)

## 2022-08-02 LAB — COMPREHENSIVE METABOLIC PANEL
ALT: 19 U/L (ref 0–44)
AST: 17 U/L (ref 15–41)
Albumin: 3.9 g/dL (ref 3.5–5.0)
Alkaline Phosphatase: 47 U/L (ref 38–126)
Anion gap: 9 (ref 5–15)
BUN: 10 mg/dL (ref 6–20)
CO2: 24 mmol/L (ref 22–32)
Calcium: 8.6 mg/dL — ABNORMAL LOW (ref 8.9–10.3)
Chloride: 103 mmol/L (ref 98–111)
Creatinine, Ser: 0.55 mg/dL (ref 0.44–1.00)
GFR, Estimated: >60 mL/min (ref >60)
Glucose, Bld: 110 mg/dL — ABNORMAL HIGH (ref 70–99)
Potassium: 2.8 mmol/L — ABNORMAL LOW (ref 3.5–5.1)
Sodium: 136 mmol/L (ref 135–145)
Total Bilirubin: 0.5 mg/dL (ref 0.3–1.2)
Total Protein: 7.6 g/dL (ref 6.5–8.1)

## 2022-08-02 LAB — LACTIC ACID, PLASMA
Lactic Acid, Venous: 1.1 mmol/L (ref 0.5–1.9)
Lactic Acid, Venous: 1.2 mmol/L (ref 0.5–1.9)

## 2022-08-02 LAB — RESP PANEL BY RT-PCR (RSV, FLU A&B, COVID)  RVPGX2
Influenza A by PCR: NEGATIVE
Influenza B by PCR: NEGATIVE
Resp Syncytial Virus by PCR: NEGATIVE
SARS Coronavirus 2 by RT PCR: NEGATIVE

## 2022-08-02 LAB — PROTIME-INR
INR: 1.1 (ref 0.8–1.2)
Prothrombin Time: 13.8 seconds (ref 11.4–15.2)

## 2022-08-02 LAB — APTT: aPTT: 28 seconds (ref 24–36)

## 2022-08-02 LAB — MAGNESIUM: Magnesium: 1.6 mg/dL — ABNORMAL LOW (ref 1.7–2.4)

## 2022-08-02 MED ORDER — LEVOTHYROXINE SODIUM 50 MCG PO TABS
50.0000 ug | ORAL_TABLET | Freq: Every day | ORAL | Status: DC
  Administered 2022-08-03: 50 ug via ORAL
  Filled 2022-08-02: qty 1

## 2022-08-02 MED ORDER — SODIUM CHLORIDE 0.9 % IV SOLN: INTRAVENOUS | Status: DC

## 2022-08-02 MED ORDER — ESCITALOPRAM OXALATE 10 MG PO TABS
20.0000 mg | ORAL_TABLET | Freq: Every day | ORAL | Status: DC
  Administered 2022-08-03: 20 mg via ORAL
  Filled 2022-08-02: qty 2

## 2022-08-02 MED ORDER — EZETIMIBE 10 MG PO TABS: 10.0000 mg | ORAL_TABLET | Freq: Every day | ORAL | Status: DC

## 2022-08-02 MED ORDER — METHOCARBAMOL 1000 MG/10ML IJ SOLN: 500.0000 mg | Freq: Four times a day (QID) | INTRAVENOUS | Status: DC | PRN

## 2022-08-02 MED ORDER — PANTOPRAZOLE SODIUM 40 MG PO TBEC
40.0000 mg | DELAYED_RELEASE_TABLET | Freq: Every day | ORAL | Status: DC
  Administered 2022-08-03: 40 mg via ORAL
  Filled 2022-08-02: qty 1

## 2022-08-02 MED ORDER — LORATADINE 10 MG PO TABS
10.0000 mg | ORAL_TABLET | Freq: Every day | ORAL | Status: DC
  Administered 2022-08-02: 10 mg via ORAL
  Filled 2022-08-02: qty 1

## 2022-08-02 MED ORDER — POTASSIUM CHLORIDE 10 MEQ/100ML IV SOLN
10.0000 meq | INTRAVENOUS | Status: AC
  Administered 2022-08-02 – 2022-08-03 (×3): 10 meq via INTRAVENOUS
  Filled 2022-08-02 (×3): qty 100

## 2022-08-02 MED ORDER — ONDANSETRON HCL 4 MG/2ML IJ SOLN
4.0000 mg | Freq: Once | INTRAMUSCULAR | Status: AC
  Administered 2022-08-02: 4 mg via INTRAVENOUS
  Filled 2022-08-02: qty 2

## 2022-08-02 MED ORDER — MAGNESIUM SULFATE 2 GM/50ML IV SOLN
2.0000 g | Freq: Once | INTRAVENOUS | Status: AC
  Administered 2022-08-02: 2 g via INTRAVENOUS
  Filled 2022-08-02: qty 50

## 2022-08-02 MED ORDER — KETOROLAC TROMETHAMINE 30 MG/ML IJ SOLN
30.0000 mg | Freq: Once | INTRAMUSCULAR | Status: AC
  Administered 2022-08-02: 30 mg via INTRAVENOUS
  Filled 2022-08-02: qty 1

## 2022-08-02 MED ORDER — KETOROLAC TROMETHAMINE 30 MG/ML IJ SOLN
30.0000 mg | Freq: Four times a day (QID) | INTRAMUSCULAR | Status: DC | PRN
  Administered 2022-08-03: 30 mg via INTRAVENOUS
  Filled 2022-08-02: qty 1

## 2022-08-02 MED ORDER — SODIUM CHLORIDE 0.9 % IV BOLUS
1000.0000 mL | Freq: Once | INTRAVENOUS | Status: AC
  Administered 2022-08-02: 1000 mL via INTRAVENOUS

## 2022-08-02 MED ORDER — ENOXAPARIN SODIUM 40 MG/0.4ML IJ SOSY
40.0000 mg | PREFILLED_SYRINGE | INTRAMUSCULAR | Status: DC
  Administered 2022-08-02: 40 mg via SUBCUTANEOUS
  Filled 2022-08-02: qty 0.4

## 2022-08-02 MED ORDER — LEVOCETIRIZINE DIHYDROCHLORIDE 5 MG PO TABS: 5.0000 mg | ORAL_TABLET | Freq: Every day | ORAL | Status: DC

## 2022-08-02 MED ORDER — POTASSIUM CHLORIDE 10 MEQ/100ML IV SOLN
10.0000 meq | Freq: Once | INTRAVENOUS | Status: AC
  Administered 2022-08-02: 10 meq via INTRAVENOUS
  Filled 2022-08-02: qty 100

## 2022-08-02 MED ORDER — MONTELUKAST SODIUM 10 MG PO TABS
10.0000 mg | ORAL_TABLET | Freq: Every day | ORAL | Status: DC
  Administered 2022-08-03: 10 mg via ORAL
  Filled 2022-08-02 (×2): qty 1

## 2022-08-02 MED ORDER — ACETAMINOPHEN 500 MG PO TABS: 1000.0000 mg | ORAL_TABLET | Freq: Once | ORAL | Status: DC

## 2022-08-02 MED ORDER — ACETAMINOPHEN 325 MG PO TABS
650.0000 mg | ORAL_TABLET | Freq: Four times a day (QID) | ORAL | Status: DC | PRN
  Administered 2022-08-03 (×2): 650 mg via ORAL
  Filled 2022-08-02 (×2): qty 2

## 2022-08-02 MED ORDER — IOHEXOL 300 MG/ML  SOLN
100.0000 mL | Freq: Once | INTRAMUSCULAR | Status: AC | PRN
  Administered 2022-08-02: 100 mL via INTRAVENOUS

## 2022-08-02 MED ORDER — ACETAMINOPHEN 650 MG RE SUPP: 650.0000 mg | Freq: Four times a day (QID) | RECTAL | Status: DC | PRN

## 2022-08-02 NOTE — Assessment & Plan Note (Signed)
Likely secondary to extra renal losses Replete and follow

## 2022-08-02 NOTE — ED Notes (Signed)
Provider bedside.

## 2022-08-02 NOTE — H&P (Signed)
History and Physical    Patient: Breanna [name] QMG:[id number] DOB: October 14, 1970 DOA: 08/02/2022 DOS: the patient was seen and examined on 08/03/2022 PCP: Jettie Booze, NP  Patient coming from: Home - lives with her husband.    Chief Complaint: N/V, fever and body pain   HPI: Breanna [name] is a 51 y.o. female with medical history significant of anxiety, HLD who presented to ED with complaints of fever, N/V and stomach pain.  She states she started to have a runny nose and sore throat on Friday.  Sunday night she woke up around 12:00 throwing up and diarrhea with fever and body aches. She threw up 6x or more last night. She states the body aches were so bad despite taking tylenol so came to ED. She continued to run fever despite taking tylenol as well. She has some nasal congestion, but no cough or shortness of breath. She also reports diarrhea, but this seems to have resolved. She denies eating fried rice/raw foods. No travel. She has been around kids with the flu other viral illness at work. She can't remember if she had her flu shot. She usually gets yearly. She also states her throat really hurts. No problems swallowing or breathing. She has no pain after being given toradol.   She does not smoke or drink.     Denies any vision changes/headaches, chest pain or palpitations, shortness of breath or cough, dysuria or leg swelling.    ER Course:  vitals: temp: 103, bp: 141/72, HR; 120, RR: 18, oxygen: 97% on RA Pertinent labs: flu/covid negative, wbc: 17.5, potassium: 2.8, mag: 1.6 CT abdo/pelvis: NO CT etiology for acute abdominal pain. Punctate non obstructing right sided nephrolithiasis. In ED: given 52mq of potassium, fluids, zofran and TRH asked to admit.      Review of Systems: As mentioned in the history of present illness. All other systems reviewed and are negative. Past Medical History:  Diagnosis Date   Anxiety    Back pain    Depressed    Diverticulitis     Hyperlipemia    Past Surgical History:  Procedure Laterality Date   BACK SURGERY     ESOPHAGOGASTRODUODENOSCOPY (EGD) WITH PROPOFOL N/A 02/08/2017   Procedure: ESOPHAGOGASTRODUODENOSCOPY (EGD) WITH PROPOFOL;  Surgeon: HTeena Irani MD;  [facility];  Service: Endoscopy;  Laterality: N/A;   Social History:  reports that she has never smoked. She has never used smokeless tobacco. She reports that she does not drink alcohol and does not use drugs.  Allergies  Allergen Reactions   Trichophyton Itching and Other (See Comments)    Rhinitis    Family History  Problem Relation Age of Onset   Rheum arthritis Mother    Osteoarthritis Mother    Cancer Father     Prior to Admission medications   Medication Sig Start Date End Date Taking? Authorizing Provider  albuterol (PROAIR HFA) 108 (90 Base) MCG/ACT inhaler Inhale 2 puffs into the lungs every 6 (six) hours as needed for wheezing or shortness of breath.  09/12/17 09/12/18  [provider]  azelastine (OPTIVAR) 0.05 % ophthalmic solution Place 1 drop into both eyes daily. 01/21/17   [provider]  bifidobacterium infantis (ALIGN) capsule Take 1 capsule by mouth daily.    [provider]  ciprofloxacin (CIPRO) 500 MG tablet Take 500 mg by mouth 2 (two) times daily. Take for 10 days starting on 01/27/18. 01/27/18   [provider]  diclofenac sodium (VOLTAREN) 1 % GEL  Place 1 application onto the skin 4 (four) times daily as needed (Apply to feet for pain.).  06/29/17   [provider]  EPIPEN 2-PAK 0.3 MG/0.3ML SOAJ injection Inject 1 Dose into the muscle once as needed (For anaphylaxis.).  01/21/17   [provider]  escitalopram (LEXAPRO) 10 MG tablet Take 10 mg by mouth daily. 12/08/17   [provider]  ezetimibe (ZETIA) 10 MG tablet Take 10 mg by mouth daily. 09/12/17   [provider]  ferrous sulfate 325 (65 FE) MG tablet Take 325 mg by mouth daily.    [provider]  fluconazole (DIFLUCAN) 150 MG tablet Take 150 mg by mouth as needed (For yeast infection.). Take for one dose. May repeat dose in 3 days if needed. 01/27/18   [provider]  fluticasone (FLONASE) 50 MCG/ACT nasal spray Place 1 spray into both nostrils 2 (two) times daily.  01/08/17   [provider]  HYDROcodone-acetaminophen (NORCO/VICODIN) 5-325 MG tablet Take 1 tablet by mouth every 6 (six) hours as needed for moderate pain.    [provider]  levocetirizine (XYZAL) 5 MG tablet Take 5 mg by mouth daily. 12/08/17   [provider]  levothyroxine (SYNTHROID, LEVOTHROID) 50 MCG tablet Take 50 mcg by mouth every morning. 12/29/16   [provider]  lidocaine (LIDODERM) 5 % Place 1 patch onto the skin as needed (For pain.).  10/29/14   [provider]  metoCLOPramide (REGLAN) 5 MG tablet Take 1 tablet (5 mg total) by mouth every 6 (six) hours. 02/06/18   Langston Masker B, PA-C  metroNIDAZOLE (FLAGYL) 500 MG tablet Take 500 mg by mouth 3 (three) times daily. Take for 10 days starting on 01/27/18. 01/27/18   [provider]  montelukast (SINGULAIR) 10 MG tablet Take 10 mg by mouth at bedtime.  01/21/17   [provider]  Multiple Vitamin (MULITIVITAMIN WITH MINERALS) TABS Take 1 tablet by mouth daily.    [provider]  Omega-3 Fatty Acids (FISH OIL) 1000 MG CAPS Take 1,000 mg by mouth daily.     [provider]  ondansetron (ZOFRAN-ODT) 8 MG disintegrating tablet Take 8 mg by mouth every 8 (eight) hours as needed for nausea or vomiting.  01/27/18   [provider]  RABEprazole (ACIPHEX) 20 MG tablet Take 20 mg by mouth daily. 12/05/17   [provider]  topiramate (TOPAMAX) 50 MG tablet Take 75 mg by mouth 2 (two) times daily. 01/28/17   [provider]    Physical Exam: Vitals:   08/02/22 2200 08/02/22 2300 08/03/22 0000 08/03/22 0100  BP:  126/67 104/71 110/67  Pulse:  95 97  95  Resp:  (!) 21 (!) 21 20  Temp: 99.1 F (37.3 C)     TempSrc: Oral     SpO2:  95% 95% 95%  Weight:      Height:       General:  Appears calm and comfortable and is in NAD Eyes:  PERRL, EOMI, normal lids, iris ENT:  grossly normal hearing, lips & tongue, mmm; appropriate dentition. Erythema of uvula/tonsils, posterior pharynx, but no exudate/edema.  Neck:  no LAD, masses or thyromegaly; no carotid bruits Cardiovascular:  RRR, no m/r/g. No LE edema.  Respiratory:   CTA bilaterally with no wheezes/rales/rhonchi.  Normal respiratory effort. Abdomen:  soft, NT, ND, NABS Back:   normal alignment, no CVAT Skin:  no rash or induration seen on limited exam Musculoskeletal:  grossly normal tone  BUE/BLE, good ROM, no bony abnormality Lower extremity:  No LE edema.  Limited foot exam with no ulcerations.  2+ distal pulses. Psychiatric:  grossly normal mood and affect, speech fluent and appropriate, AOx3 Neurologic:  CN 2-12 grossly intact, moves all extremities in coordinated fashion, sensation intact   Radiological Exams on Admission: Independently reviewed - see discussion in A/P where applicable  DG CHEST PORT 1 VIEW  Result Date: 08/02/2022 CLINICAL DATA:  Fever, sore throat, body aches EXAM: PORTABLE CHEST 1 VIEW COMPARISON:  01/26/2017 FINDINGS: Cardiac and mediastinal contours are within normal limits given AP technique. Low lung volumes. No focal pulmonary opacity. No pleural effusion or pneumothorax. No acute osseous abnormality. IMPRESSION: No acute cardiopulmonary process. Electronically Signed   By: Merilyn Baba M.D.   On: 08/02/2022 23:20   CT ABDOMEN PELVIS W CONTRAST  Result Date: 08/02/2022 CLINICAL DATA:  LLQ abdominal pain EXAM: CT ABDOMEN AND PELVIS WITH CONTRAST TECHNIQUE: Multidetector CT imaging of the abdomen and pelvis was performed using the standard protocol following bolus administration of intravenous contrast. RADIATION DOSE REDUCTION: This exam was performed  according to the departmental dose-optimization program which includes automated exposure control, adjustment of the mA and/or kV according to patient size and/or use of iterative reconstruction technique. CONTRAST:  143m OMNIPAQUE IOHEXOL 300 MG/ML  SOLN COMPARISON:  February 06, 2018 FINDINGS: Lower chest: No acute abnormality. Hepatobiliary: No focal liver abnormality is seen. Query underlying hepatic steatosis with hepatomegaly. No gallstones, gallbladder wall thickening, or biliary dilatation. Pancreas: Unremarkable. No pancreatic ductal dilatation or surrounding inflammatory changes. Spleen: Normal in size without focal abnormality. Adrenals/Urinary Tract: Adrenal glands are unremarkable. No hydronephrosis. Exophytic cyst of the inferior pole of the RIGHT kidney (for which no dedicated imaging follow-up is recommended). Punctate nonobstructing RIGHT-sided nephrolithiasis. No obstructing nephrolithiasis. Bladder is unremarkable. Stomach/Bowel: No evidence of bowel obstruction. Pancolonic diverticulosis without evidence of acute diverticulitis. Appendix is normal. Stomach is unremarkable. Vascular/Lymphatic: No significant vascular findings are present. No enlarged abdominal or pelvic lymph nodes. Reproductive: Uterus and bilateral adnexa are unremarkable. Other: No free air or free fluid. Musculoskeletal: Status post posterior fixation of L5-S1. IMPRESSION: 1. No CT etiology for acute abdominal pain is identified. 2. Likely hepatic steatosis. Electronically Signed   By: SValentino SaxonM.D.   On: 08/02/2022 19:34    EKG: Independently reviewed.  NSR with rate 105; nonspecific ST changes with no evidence of acute ischemia. Borderline prolonged qt   Labs on Admission: I have personally reviewed the available labs and imaging studies at the time of the admission.  Pertinent labs:   flu/covid negative,  wbc: 17.5,  potassium: 2.8,  mag: 1.6  Assessment and Plan: Principal Problem:   SIRS (systemic  inflammatory response syndrome) (HCC) Active Problems:   Hypokalemia/hypomagnesia   Prolonged QT interval   GAD (generalized anxiety disorder)   Gastroesophageal reflux disease   Hypothyroidism   Hypomagnesemia    Assessment and Plan: * S[place] (systemic inflammatory response syndrome) [place] 51[place] presenting with 2 day history of N/V, fever and body aches who met SIRS criteria with temp to 103, HR of 120 and WBC of 17.5 -obs to telemetry -no bacterial source identified at this point. Have ordered a CXR, UA with no infection and CT abdomen/pelvis with no acute finding. Covid/flu negative, although symptoms suspicious for the flu.  -checking RVP/PCT -cultures pending -lactic acid wnl  -no indication for abx at this time -sore throat, but low centor criteria and no abscess/swelling appreciated. Continue to monitor  closely  -WBC likely reactionary/inflamm. Continue to trend/IVF   Hypokalemia/hypomagnesia Likely secondary to extra renal losses Replete and follow   Prolonged QT interval Optimize electrolytes Keep on telemetry Avoid qt prolonging drugs  Repeat ekg in AM    GAD (generalized anxiety disorder) Continue lexapro daily Recheck EKG in AM for borderline prolonged QT   Gastroesophageal reflux disease Continue PPI   Hypothyroidism Check TSH Continue home synthroid     Advance Care Planning:   Code Status: Full Code discussed with patient   Consults: none   DVT Prophylaxis: lovenox   Family Communication: none   Severity of Illness: The appropriate patient status for this patient is OBSERVATION. Observation status is judged to be reasonable and necessary in order to provide the required intensity of service to ensure the patient's safety. The patient's presenting symptoms, physical exam findings, and initial radiographic and laboratory data in the context of their medical condition is felt to place them at decreased risk for further clinical deterioration.  Furthermore, it is anticipated that the patient will be medically stable for discharge from the hospital within 2 midnights of admission.   Author: Orma Flaming, MD 08/03/2022 1:31 AM  For on call review [email or web address].

## 2022-08-02 NOTE — Assessment & Plan Note (Signed)
52 year old presenting with 2 day history of N/V, fever and body aches who met SIRS criteria with temp to 103, HR of 120 and WBC of 17.5 -obs to telemetry -no bacterial source identified at this point. Have ordered a CXR, UA with no infection and CT abdomen/pelvis with no acute finding. Covid/flu negative, although symptoms suspicious for the flu.  -checking RVP/PCT -cultures pending -no indication for abx at this time -sore throat, but low centor criteria and no abscess/swelling appreciated. Continue to monitor closely  -WBC likely reactionary/inflamm. Continue to trend/IVF

## 2022-08-02 NOTE — ED Provider Triage Note (Signed)
Emergency Medicine Provider Triage Evaluation Note  Breanna [name] , a 51 y.o. female  was evaluated in triage.  Pt complains of headache, nasal congestion, sore throat, body aching since yesterday.  Patient reports fever at home, she took Tylenol with no improvement.  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  Patient also reports nausea, vomiting x6 and diarrhea.  Review of Systems  Positive: As above Negative: As above  Physical Exam  BP (!) 141/72 (BP Location: Left Arm)   Pulse (!) 120   Temp (!) 103 F (39.4 C) (Oral)   Resp 18   Ht '5\' 1"'$  (1.549 m)   Wt 92.5 kg   LMP 08/24/2011 Comment: negative urine pregnancy test 02-06-2018  SpO2 97%   BMI 38.55 kg/m  Gen:   Awake, no distress   Resp:  Normal effort  MSK:   Moves extremities without difficulty  Other:    Medical Decision Making  Medically screening exam initiated at 12:05 PM.  Appropriate orders placed.  Breanna [name] was informed that the remainder of the evaluation will be completed by another provider, this initial triage assessment does not replace that evaluation, and the importance of remaining in the ED until their evaluation is complete.     Rex Kras, [place] 08/02/22 2027

## 2022-08-02 NOTE — Assessment & Plan Note (Signed)
Continue protonix  

## 2022-08-02 NOTE — Assessment & Plan Note (Signed)
Optimize electrolytes Keep on telemetry Avoid qt prolonging drugs  Repeat ekg in AM   

## 2022-08-02 NOTE — Assessment & Plan Note (Signed)
Check TSH ?Continue home synthroid  ?

## 2022-08-02 NOTE — ED Provider Notes (Addendum)
[facility] DEPT Provider Note   CSN: [id number] Arrival date & time: 08/02/22  1123     History  Chief Complaint  Patient presents with   Fever   Headache   Generalized Body Aches   Emesis   Sore Throat    Breanna [name] is a 51 y.o. female presents to the ED with a complaint of fever, body aches, sore throat, nausea, vomiting, diarrhea, and abdominal pain that started last night.  She states that she has been around students with similar symptoms that have tested positive for the flu.  She states that she took some Tylenol extra strength tablets at 0900 today but has had minimal relief of her symptoms.  She also reports a throbbing headache.  She states her abdominal pain is in the lower left quadrant, and has a history of diverticulitis which has her concerned.  Denies hematochezia, melena, hematemesis, shortness of breath, chest pain, difficulty swallowing.       Home Medications Prior to Admission medications   Medication Sig Start Date End Date Taking? Authorizing Provider  albuterol (PROAIR HFA) 108 (90 Base) MCG/ACT inhaler Inhale 2 puffs into the lungs every 6 (six) hours as needed for wheezing or shortness of breath.  09/12/17 09/12/18  [provider]  azelastine (OPTIVAR) 0.05 % ophthalmic solution Place 1 drop into both eyes daily. 01/21/17   [provider]  bifidobacterium infantis (ALIGN) capsule Take 1 capsule by mouth daily.    [provider]  ciprofloxacin (CIPRO) 500 MG tablet Take 500 mg by mouth 2 (two) times daily. Take for 10 days starting on 01/27/18. 01/27/18   [provider]  diclofenac sodium (VOLTAREN) 1 % GEL Place 1 application onto the skin 4 (four) times daily as needed (Apply to feet for pain.).  06/29/17   [provider]  EPIPEN 2-PAK 0.3 MG/0.3ML SOAJ injection Inject 1 Dose into the muscle once as needed (For anaphylaxis.).  01/21/17   [provider]  escitalopram  (LEXAPRO) 10 MG tablet Take 10 mg by mouth daily. 12/08/17   [provider]  ezetimibe (ZETIA) 10 MG tablet Take 10 mg by mouth daily. 09/12/17   [provider]  ferrous sulfate 325 (65 FE) MG tablet Take 325 mg by mouth daily.    [provider]  fluconazole (DIFLUCAN) 150 MG tablet Take 150 mg by mouth as needed (For yeast infection.). Take for one dose. May repeat dose in 3 days if needed. 01/27/18   [provider]  fluticasone (FLONASE) 50 MCG/ACT nasal spray Place 1 spray into both nostrils 2 (two) times daily.  01/08/17   [provider]  HYDROcodone-acetaminophen (NORCO/VICODIN) 5-325 MG tablet Take 1 tablet by mouth every 6 (six) hours as needed for moderate pain.    [provider]  levocetirizine (XYZAL) 5 MG tablet Take 5 mg by mouth daily. 12/08/17   [provider]  levothyroxine (SYNTHROID, LEVOTHROID) 50 MCG tablet Take 50 mcg by mouth every morning. 12/29/16   [provider]  lidocaine (LIDODERM) 5 % Place 1 patch onto the skin as needed (For pain.).  10/29/14   [provider]  metoCLOPramide (REGLAN) 5 MG tablet Take 1 tablet (5 mg total) by mouth every 6 (six) hours. 02/06/18   Langston Masker B, PA-C  metroNIDAZOLE (FLAGYL) 500 MG tablet Take 500 mg by mouth 3 (three) times daily. Take for 10 days starting on 01/27/18. 01/27/18   [provider]  montelukast (SINGULAIR) 10  MG tablet Take 10 mg by mouth at bedtime.  01/21/17   [provider]  Multiple Vitamin (MULITIVITAMIN WITH MINERALS) TABS Take 1 tablet by mouth daily.    [provider]  Omega-3 Fatty Acids (FISH OIL) 1000 MG CAPS Take 1,000 mg by mouth daily.     [provider]  ondansetron (ZOFRAN-ODT) 8 MG disintegrating tablet Take 8 mg by mouth every 8 (eight) hours as needed for nausea or vomiting.  01/27/18   [provider]  RABEprazole (ACIPHEX) 20 MG tablet Take 20 mg by mouth daily. 12/05/17    [provider]  topiramate (TOPAMAX) 50 MG tablet Take 75 mg by mouth 2 (two) times daily. 01/28/17   [provider]      Allergies    Mold extract [trichophyton]    Review of Systems   Review of Systems  Constitutional:  Positive for fatigue and fever.  HENT:  Positive for sore throat. Negative for trouble swallowing.   Respiratory:  Negative for cough and shortness of breath.   Cardiovascular:  Negative for chest pain.  Gastrointestinal:  Positive for abdominal pain, diarrhea, nausea and vomiting. Negative for blood in stool.  Musculoskeletal:  Positive for myalgias.  Neurological:  Positive for headaches. Negative for syncope and weakness.    Physical Exam Updated Vital Signs BP 135/72   Pulse (!) 111   Temp 100.1 F (37.8 C) (Oral)   Resp 19   Ht '5\' 1"'$  (1.549 m)   Wt 92.5 kg   LMP 08/24/2011 Comment: negative urine pregnancy test 02-06-2018  SpO2 96%   BMI 38.55 kg/m  Physical Exam Vitals and nursing note reviewed.  Constitutional:      General: She is not in acute distress.    Appearance: She is ill-appearing. She is not toxic-appearing.  HENT:     Head: Normocephalic.     Right Ear: Tympanic membrane and ear canal normal.     Left Ear: Tympanic membrane and ear canal normal.     Mouth/Throat:     Mouth: Mucous membranes are moist.     Pharynx: Oropharynx is clear. Posterior oropharyngeal erythema present.  Eyes:     Conjunctiva/sclera: Conjunctivae normal.     Pupils: Pupils are equal, round, and reactive to light.  Cardiovascular:     Rate and Rhythm: Regular rhythm. Tachycardia present.     Pulses: Normal pulses.     Heart sounds: Normal heart sounds.  Pulmonary:     Effort: Pulmonary effort is normal. No respiratory distress.     Breath sounds: Normal breath sounds and air entry.  Abdominal:     General: Abdomen is flat. Bowel sounds are normal. There is no distension.     Palpations: Abdomen is soft.     Tenderness: There is  abdominal tenderness in the left upper quadrant and left lower quadrant.  Skin:    General: Skin is warm and dry.     Capillary Refill: Capillary refill takes less than 2 seconds.     Coloration: Skin is pale. Skin is not cyanotic or jaundiced.  Neurological:     Mental Status: She is alert. Mental status is at baseline.  Psychiatric:        Mood and Affect: Mood normal.        Behavior: Behavior normal.     ED Results / Procedures / Treatments   Labs (all labs ordered are listed, but only abnormal results are displayed) Labs Reviewed  CBC WITH DIFFERENTIAL/PLATELET - Abnormal;  Notable for the following components:      Result Value   WBC 17.5 (*)    Neutro Abs 14.2 (*)    Abs Immature Granulocytes 0.14 (*)    All other components within normal limits  COMPREHENSIVE METABOLIC PANEL - Abnormal; Notable for the following components:   Potassium 2.8 (*)    Glucose, Bld 110 (*)    Calcium 8.6 (*)    All other components within normal limits  URINALYSIS, ROUTINE W REFLEX MICROSCOPIC - Abnormal; Notable for the following components:   Color, Urine STRAW (*)    Specific Gravity, Urine 1.003 (*)    Hgb urine dipstick SMALL (*)    All other components within normal limits  MAGNESIUM - Abnormal; Notable for the following components:   Magnesium 1.6 (*)    All other components within normal limits  RESP PANEL BY RT-PCR (RSV, FLU A&B, COVID)  RVPGX2  CULTURE, BLOOD (ROUTINE X 2)  CULTURE, BLOOD (ROUTINE X 2)  URINE CULTURE  RESPIRATORY PANEL BY PCR  LACTIC ACID, PLASMA  LACTIC ACID, PLASMA  PROTIME-INR  APTT  MAGNESIUM    EKG None  Radiology CT ABDOMEN PELVIS W CONTRAST  Result Date: 08/02/2022 CLINICAL DATA:  LLQ abdominal pain EXAM: CT ABDOMEN AND PELVIS WITH CONTRAST TECHNIQUE: Multidetector CT imaging of the abdomen and pelvis was performed using the standard protocol following bolus administration of intravenous contrast. RADIATION DOSE REDUCTION: This exam was  performed according to the departmental dose-optimization program which includes automated exposure control, adjustment of the mA and/or kV according to patient size and/or use of iterative reconstruction technique. CONTRAST:  190m OMNIPAQUE IOHEXOL 300 MG/ML  SOLN COMPARISON:  February 06, 2018 FINDINGS: Lower chest: No acute abnormality. Hepatobiliary: No focal liver abnormality is seen. Query underlying hepatic steatosis with hepatomegaly. No gallstones, gallbladder wall thickening, or biliary dilatation. Pancreas: Unremarkable. No pancreatic ductal dilatation or surrounding inflammatory changes. Spleen: Normal in size without focal abnormality. Adrenals/Urinary Tract: Adrenal glands are unremarkable. No hydronephrosis. Exophytic cyst of the inferior pole of the RIGHT kidney (for which no dedicated imaging follow-up is recommended). Punctate nonobstructing RIGHT-sided nephrolithiasis. No obstructing nephrolithiasis. Bladder is unremarkable. Stomach/Bowel: No evidence of bowel obstruction. Pancolonic diverticulosis without evidence of acute diverticulitis. Appendix is normal. Stomach is unremarkable. Vascular/Lymphatic: No significant vascular findings are present. No enlarged abdominal or pelvic lymph nodes. Reproductive: Uterus and bilateral adnexa are unremarkable. Other: No free air or free fluid. Musculoskeletal: Status post posterior fixation of L5-S1. IMPRESSION: 1. No CT etiology for acute abdominal pain is identified. 2. Likely hepatic steatosis. Electronically Signed   By: SValentino SaxonM.D.   On: 08/02/2022 19:34    Procedures Procedures    Medications Ordered in ED Medications  potassium chloride 10 mEq in 100 mL IVPB (10 mEq Intravenous New Bag/Given 08/02/22 2059)  magnesium sulfate IVPB 2 g 50 mL (has no administration in time range)  potassium chloride 10 mEq in 100 mL IVPB (has no administration in time range)  sodium chloride 0.9 % bolus 1,000 mL (0 mLs Intravenous Stopped 08/02/22  1821)  ondansetron (ZOFRAN) injection 4 mg (4 mg Intravenous Given 08/02/22 1709)  ketorolac (TORADOL) 30 MG/ML injection 30 mg (30 mg Intravenous Given 08/02/22 2017)  iohexol (OMNIPAQUE) 300 MG/ML solution 100 mL (100 mLs Intravenous Contrast Given 08/02/22 1904)    ED Course/ Medical Decision Making/ A&P  Medical Decision Making Amount and/or Complexity of Data Reviewed Labs: ordered. Radiology: ordered. ECG/medicine tests: ordered.  Risk Prescription drug management. Decision regarding hospitalization.   This patient presents to the ED with chief complaint(s) of flulike symptoms and abdominal pain with pertinent past medical history of GERD, GI bleed, diverticulitis.The complaint involves an extensive differential diagnosis and also carries with it a high risk of complications and morbidity.    The differential diagnosis includes viral etiology, influenza, COVID, gastroenteritis, gastritis, gastroparesis, diverticulitis, colitis  The initial plan is to obtain respiratory swab, baseline labs, and give IV fluids  Initial Assessment:   On exam, patient is pale and ill-appearing but nontoxic nondiaphoretic.  She is tachycardic with regular rhythm.  Her fever has defervesced, temp 99.9 F orally.  Lungs are clear to auscultation bilaterally.  Abdomen is soft and notably tender in the left side quadrants, no distention.  Posterior oropharynx is erythematous without exudate.  Patient is able to swallow without difficulty.  Patient meets SIRS criteria, evolving sepsis order set placed.  Independent ECG/labs interpretation:  The following labs were independently interpreted:  COVID, influenza, and RSV negative CBC significant for leukocytosis with a WBC of 17.5, with neutrophilia, neutrophil count 14.2. Metabolic panel significant for hypokalemia, K at 2.8, mild hypocalcemia at 8.6.  LFTs and kidney function are normal. Magnesium low at 1.6 APTT, pro time INR,  lactic acid all within normal range Urinalysis only significant for straw color and small amount of Hgb.  No evidence of UTI, leukocyte and nitrite negative.  Independent visualization and interpretation of imaging: I independently visualized the following imaging with scope of interpretation limited to determining acute life threatening conditions related to emergency care: CT abdomen pelvis based on patient having abdominal pain, meeting SIRS criteria, and having history of diverticulitis - no abnormal CT findings to explain abdominal pain, no evidence of bowel obstruction, no diverticulitis.  Treatment and Reassessment: Will give patient IV NS fluid bolus due to multiple episodes of vomiting and diarrhea.  Fever has defervesced, patient last took '1000mg'$  Tylenol at 0900.  Gave patient IV Toradol, patient reports her body aches and headache greatly improved.  Will redose Tylenol as needed for fever control.  Gave patient IV potassium for hypokalemia.  Patient continues to be tachycardic, temperature has also been steadily increasing.  Patient does report feeling better following IV fluids and Toradol.  Other treatment options considered:   Ibuprofen was considered, however, given patient's history of GI bleed and GERD will refrain from using oral NSAIDs for myalgias and fever control.    Disposition:   Patient was negative for COVID, flu, and RSV.  CT abdomen pelvis revealed no abnormal findings to explain abdominal pain.  Patient's symptoms could still be viral etiology, however, based on her meeting SIRS criteria and having an elevated white count, will request consultation with hospitalist to have patient admitted.  I spoke with on-call provider, Orma Flaming, who recommended admitting patient.  Patient did meet SIRS criteria.  Discussed hospital admission with patient who is in agreement.  Patient to be admitted for ongoing treatment/management of her condition.    Discussed HPI, physical exam  findings, assessment and plan with attending Milton Ferguson who agrees with current plan.           Final Clinical Impression(s) / ED Diagnoses Final diagnoses:  Hypokalemia  SIRS (systemic inflammatory response syndrome) ([place])    Rx / DC Orders ED Discharge Orders     None  Pat Kocher, [place] 08/02/22 2147    Pat Kocher, [place] 08/02/22 2148    Milton Ferguson, MD 08/12/22 1456

## 2022-08-02 NOTE — Assessment & Plan Note (Signed)
Continue lexapro daily Recheck EKG in AM for borderline prolonged QT

## 2022-08-02 NOTE — ED Triage Notes (Signed)
Patient reports that she has had a fever, body aches, sore throat, N/V, abdominal pain, sore throat since lst night.  Patient states she had Tylenol ES 2 tabs at 0900 today.

## 2022-08-02 NOTE — ED Notes (Signed)
Labs for CT

## 2022-08-03 DIAGNOSIS — E876 Hypokalemia: Secondary | ICD-10-CM

## 2022-08-03 DIAGNOSIS — R6510 Systemic inflammatory response syndrome (SIRS) of non-infectious origin without acute organ dysfunction: Secondary | ICD-10-CM | POA: Diagnosis not present

## 2022-08-03 LAB — RESPIRATORY PANEL BY PCR

## 2022-08-03 LAB — CBC
HCT: 34.7 % — ABNORMAL LOW (ref 36.0–46.0)
Hemoglobin: 10.9 g/dL — ABNORMAL LOW (ref 12.0–15.0)
MCH: 27.7 pg (ref 26.0–34.0)
MCHC: 31.4 g/dL (ref 30.0–36.0)
MCV: 88.1 fL (ref 80.0–100.0)
Platelets: 270 10*3/uL (ref 150–400)
RBC: 3.94 MIL/uL (ref 3.87–5.11)
RDW: 13.7 % (ref 11.5–15.5)
WBC: 16.6 10*3/uL — ABNORMAL HIGH (ref 4.0–10.5)
nRBC: 0 % (ref 0.0–0.2)

## 2022-08-03 LAB — TSH: TSH: 1.281 u[IU]/mL (ref 0.350–4.500)

## 2022-08-03 LAB — BASIC METABOLIC PANEL
Anion gap: 6 (ref 5–15)
BUN: 8 mg/dL (ref 6–20)
CO2: 25 mmol/L (ref 22–32)
Calcium: 8.2 mg/dL — ABNORMAL LOW (ref 8.9–10.3)
Chloride: 106 mmol/L (ref 98–111)
Creatinine, Ser: 0.55 mg/dL (ref 0.44–1.00)
GFR, Estimated: >60 mL/min (ref >60)
Glucose, Bld: 127 mg/dL — ABNORMAL HIGH (ref 70–99)
Potassium: 3.4 mmol/L — ABNORMAL LOW (ref 3.5–5.1)
Sodium: 137 mmol/L (ref 135–145)

## 2022-08-03 LAB — MAGNESIUM: Magnesium: 2.2 mg/dL (ref 1.7–2.4)

## 2022-08-03 LAB — GROUP A STREP BY PCR: Group A Strep by PCR: DETECTED — AB

## 2022-08-03 LAB — PROCALCITONIN: Procalcitonin: <0.1 ng/mL

## 2022-08-03 LAB — HIV ANTIBODY (ROUTINE TESTING W REFLEX): HIV Screen 4th Generation wRfx: NONREACTIVE

## 2022-08-03 MED ORDER — PHENOL 1.4 % MT LIQD
1.0000 | OROMUCOSAL | Status: DC | PRN
  Administered 2022-08-03: 1 via OROMUCOSAL
  Filled 2022-08-03: qty 177

## 2022-08-03 MED ORDER — AMOXICILLIN 500 MG PO CAPS
500.0000 mg | ORAL_CAPSULE | Freq: Three times a day (TID) | ORAL | Status: DC
  Administered 2022-08-03 (×2): 500 mg via ORAL
  Filled 2022-08-03 (×2): qty 1

## 2022-08-03 MED ORDER — POTASSIUM CHLORIDE CRYS ER 20 MEQ PO TBCR: 20.0000 meq | EXTENDED_RELEASE_TABLET | Freq: Every day | ORAL | 0 refills | Status: AC

## 2022-08-03 MED ORDER — PHENOL 1.4 % MT LIQD: 1.0000 | OROMUCOSAL | 0 refills | Status: DC | PRN

## 2022-08-03 MED ORDER — MAGNESIUM OXIDE -MG SUPPLEMENT 400 (240 MG) MG PO TABS: 400.0000 mg | ORAL_TABLET | Freq: Every day | ORAL | 0 refills | Status: AC

## 2022-08-03 MED ORDER — KETOROLAC TROMETHAMINE 10 MG PO TABS: 10.0000 mg | ORAL_TABLET | Freq: Four times a day (QID) | ORAL | 0 refills | Status: DC | PRN

## 2022-08-03 MED ORDER — POTASSIUM CHLORIDE CRYS ER 20 MEQ PO TBCR
20.0000 meq | EXTENDED_RELEASE_TABLET | Freq: Every day | ORAL | Status: DC
  Administered 2022-08-03: 20 meq via ORAL
  Filled 2022-08-03: qty 1

## 2022-08-03 MED ORDER — AMOXICILLIN 500 MG PO CAPS: 500.0000 mg | ORAL_CAPSULE | Freq: Three times a day (TID) | ORAL | 0 refills | Status: AC

## 2022-08-03 MED ORDER — MAGNESIUM OXIDE -MG SUPPLEMENT 400 (240 MG) MG PO TABS
400.0000 mg | ORAL_TABLET | Freq: Every day | ORAL | Status: DC
  Administered 2022-08-03: 400 mg via ORAL
  Filled 2022-08-03: qty 1

## 2022-08-03 NOTE — Discharge Summary (Signed)
Physician Discharge Summary  Breanna [name] LYY:[id number] DOB: [birth year] DOA: 08/02/2022  PCP: Jettie Booze, NP  Admit date: 08/02/2022 Discharge date: 08/03/2022  Admitted From: Home Disposition: Home  Recommendations for Outpatient Follow-up:  Follow up with PCP in 1-2 weeks Please obtain magnesium/BMP/CBC in one week   Home Health: N/A Equipment/Devices: N/A  Discharge Condition: Stable CODE STATUS: Full code Diet recommendation: Regular diet, adequate hydration  Discharge summary: 51 year old with history of anxiety and hyperlipidemia presented with 2 days of fever, myalgia, stomach pain and diarrhea, runny nose and sore throat.  She was also vomiting.  Not relieved with Tylenol and home remedies so came to the hospital. At the emergency room temperature 103, blood pressure stable.  Heart rate 120.  On room air. COVID-19 and influenza negative.  Extended respiratory virus panel negative. CT scan abdomen pelvis with no acute pathology. Strep a PCR positive. Potassium 2.8.  Magnesium 1.6.  Patient was monitored in the hospital due to significant dehydration, hypokalemia and hypomagnesemia.  Treated with IV fluids.  Blood cultures were drawn.  Negative so far.  Subsequent throat swab was done for strep a and was positive.  Procalcitonin less than 0.1.  Clinically improving.  Able to keep oral hydration.  After using Chloraseptic, able to swallow liquid diet.  Electrolytes are adequate.  Willing to go home and get treated at home. -Amoxicillin 500 mg 3 times daily for 10 days. -Over-the-counter cough medications, Tylenol.  Adequate hydration.  Prescribed a short course of Toradol that she was benefited to control her body ache. -Will follow-up blood culture reports if any positive. -Short course of potassium and magnesium prescribed.  Resume all home medications.   Discharge Diagnoses:  Principal Problem:   SIRS (systemic inflammatory response syndrome) (HCC) Active  Problems:   Hypokalemia/hypomagnesia   Prolonged QT interval   GAD (generalized anxiety disorder)   Gastroesophageal reflux disease   Hypothyroidism   Hypomagnesemia    Discharge Instructions  Discharge Instructions     Diet - low sodium heart healthy   Complete by: As directed    Diet general   Complete by: As directed    Discharge instructions   Complete by: As directed    You can take over-the-counter cough medications.  Do not exceed total Tylenol intake more than 4 g in 24 hours. You can go back to work after fever free for more than 24 hours. Keep yourself well-hydrated.   Increase activity slowly   Complete by: As directed       Allergies as of 08/03/2022       Reactions   Trichophyton Itching, Other (See Comments)   Rhinitis        Medication List     STOP taking these medications    metoCLOPramide 5 MG tablet Commonly known as: REGLAN       TAKE these medications    acetaminophen 650 MG CR tablet Commonly known as: TYLENOL Take 650 mg by mouth every 8 (eight) hours as needed for pain.   albuterol 108 (90 Base) MCG/ACT inhaler Commonly known as: VENTOLIN HFA Inhale 2 puffs into the lungs every 4 (four) hours as needed for wheezing or shortness of breath.   amoxicillin 500 MG capsule Commonly known as: AMOXIL Take 1 capsule (500 mg total) by mouth 3 (three) times daily for 10 days.   clonazePAM 0.5 MG tablet Commonly known as: KLONOPIN Take 0.5 mg by mouth 2 (two) times daily as needed for anxiety.   EpiPen 2-Pak  0.3 mg/0.3 mL Soaj injection Generic drug: EPINEPHrine Inject 0.3 mg into the muscle once as needed for anaphylaxis.   escitalopram 20 MG tablet Commonly known as: LEXAPRO Take 20 mg by mouth in the morning.   Fish Oil 1000 MG Caps Take 1,000 mg by mouth 2 (two) times a week.   fluticasone 50 MCG/ACT nasal spray Commonly known as: FLONASE Place 1 spray into both nostrils 2 (two) times daily as needed for allergies or  rhinitis.   ketorolac 10 MG tablet Commonly known as: TORADOL Take 1 tablet (10 mg total) by mouth every 6 (six) hours as needed for moderate pain or severe pain.   levocetirizine 5 MG tablet Commonly known as: XYZAL Take 5 mg by mouth at bedtime.   levothyroxine 50 MCG tablet Commonly known as: SYNTHROID Take 50 mcg by mouth daily before breakfast.   magnesium oxide 400 (240 Mg) MG tablet Commonly known as: MAG-OX Take 1 tablet (400 mg total) by mouth daily for 7 days. Start taking on: August 04, 2022   montelukast 10 MG tablet Commonly known as: SINGULAIR Take 10 mg by mouth at bedtime.   multivitamin with minerals Tabs tablet Take 1 tablet by mouth daily with breakfast.   NyQuil Severe Cold/Flu 5-6.25-10-325 MG/15ML Liqd Generic drug: Phenyleph-Doxylamine-DM-APAP Take 15-30 mLs by mouth every 4 (four) hours as needed (for flu-like symptoms).   omeprazole 20 MG capsule Commonly known as: PRILOSEC Take 20 mg by mouth daily before lunch.   phenol 1.4 % Liqd Commonly known as: CHLORASEPTIC Use as directed 1 spray in the mouth or throat every 2 (two) hours as needed for throat irritation / pain.   potassium chloride SA 20 MEQ tablet Commonly known as: KLOR-CON M Take 1 tablet (20 mEq total) by mouth daily for 7 days. Start taking on: August 04, 2022   Voltaren 1 % Gel Generic drug: diclofenac sodium Place 2 g onto the skin 4 (four) times daily as needed (Apply to feet for pain.).   Zetia 10 MG tablet Generic drug: ezetimibe Take 10 mg by mouth at bedtime.        Allergies  Allergen Reactions   Trichophyton Itching and Other (See Comments)    Rhinitis    Consultations: None   Procedures/Studies: DG CHEST PORT 1 VIEW  Result Date: 08/02/2022 CLINICAL DATA:  Fever, sore throat, body aches EXAM: PORTABLE CHEST 1 VIEW COMPARISON:  01/26/2017 FINDINGS: Cardiac and mediastinal contours are within normal limits given AP technique. Low lung volumes. No  focal pulmonary opacity. No pleural effusion or pneumothorax. No acute osseous abnormality. IMPRESSION: No acute cardiopulmonary process. Electronically Signed   By: Merilyn Baba M.D.   On: 08/02/2022 23:20   CT ABDOMEN PELVIS W CONTRAST  Result Date: 08/02/2022 CLINICAL DATA:  LLQ abdominal pain EXAM: CT ABDOMEN AND PELVIS WITH CONTRAST TECHNIQUE: Multidetector CT imaging of the abdomen and pelvis was performed using the standard protocol following bolus administration of intravenous contrast. RADIATION DOSE REDUCTION: This exam was performed according to the departmental dose-optimization program which includes automated exposure control, adjustment of the mA and/or kV according to patient size and/or use of iterative reconstruction technique. CONTRAST:  167m OMNIPAQUE IOHEXOL 300 MG/ML  SOLN COMPARISON:  February 06, 2018 FINDINGS: Lower chest: No acute abnormality. Hepatobiliary: No focal liver abnormality is seen. Query underlying hepatic steatosis with hepatomegaly. No gallstones, gallbladder wall thickening, or biliary dilatation. Pancreas: Unremarkable. No pancreatic ductal dilatation or surrounding inflammatory changes. Spleen: Normal in size without focal abnormality. Adrenals/Urinary  Tract: Adrenal glands are unremarkable. No hydronephrosis. Exophytic cyst of the inferior pole of the RIGHT kidney (for which no dedicated imaging follow-up is recommended). Punctate nonobstructing RIGHT-sided nephrolithiasis. No obstructing nephrolithiasis. Bladder is unremarkable. Stomach/Bowel: No evidence of bowel obstruction. Pancolonic diverticulosis without evidence of acute diverticulitis. Appendix is normal. Stomach is unremarkable. Vascular/Lymphatic: No significant vascular findings are present. No enlarged abdominal or pelvic lymph nodes. Reproductive: Uterus and bilateral adnexa are unremarkable. Other: No free air or free fluid. Musculoskeletal: Status post posterior fixation of L5-S1. IMPRESSION: 1. No CT  etiology for acute abdominal pain is identified. 2. Likely hepatic steatosis. Electronically Signed   By: Valentino Saxon M.D.   On: 08/02/2022 19:34   (Echo, Carotid, EGD, Colonoscopy, ERCP)    Subjective: Patient seen and examined.  In the morning rounds she was complaining of sore throat.  She had pharyngitis, strep test was done and was positive.  Took first dose of amoxicillin.  Reexamined for discharge readiness in the afternoon.  Since diagnosis is made, she wants to stay home and treated.   Discharge Exam: Vitals:   08/03/22 0745 08/03/22 1144  BP: 108/72 128/74  Pulse: 96 (!) 104  Resp: 20 18  Temp: 99.3 F (37.4 C) 100.3 F (37.9 C)  SpO2: 99% 99%   Vitals:   08/03/22 0600 08/03/22 0630 08/03/22 0745 08/03/22 1144  BP: 124/80  108/72 128/74  Pulse: 99 97 96 (!) 104  Resp: '18  20 18  '$ Temp:   99.3 F (37.4 C) 100.3 F (37.9 C)  TempSrc:   Oral Oral  SpO2: 95% 93% 99% 99%  Weight:      Height:        General: Pt is alert, awake, not in acute distress Visible erythematous uvula and posterior pharynx with no abscess, no pus point, no white patches.  Cardiovascular: RRR, S1/S2 +, no rubs, no gallops Respiratory: CTA bilaterally, no wheezing, no rhonchi Abdominal: Soft, NT, ND, bowel sounds + Extremities: no edema, no cyanosis    The results of significant diagnostics from this hospitalization (including imaging, microbiology, ancillary and laboratory) are listed below for reference.     Microbiology: Recent Results (from the past 240 hour(s))  Resp panel by RT-PCR (RSV, Flu A&B, Covid) Anterior Nasal Swab     Status: None   Collection Time: 08/02/22 12:06 PM   Specimen: Anterior Nasal Swab  Result Value Ref Range Status   SARS Coronavirus 2 by RT PCR NEGATIVE NEGATIVE Final    Comment: (NOTE) SARS-CoV-2 target nucleic acids are NOT DETECTED.  The SARS-CoV-2 RNA is generally detectable in upper respiratory specimens during the acute phase of infection. The  lowest concentration of SARS-CoV-2 viral copies this assay can detect is 138 copies/mL. A negative result does not preclude SARS-Cov-2 infection and should not be used as the sole basis for treatment or other patient management decisions. A negative result may occur with  improper specimen collection/handling, submission of specimen other than nasopharyngeal swab, presence of viral mutation(s) within the areas targeted by this assay, and inadequate number of viral copies(<138 copies/mL). A negative result must be combined with clinical observations, patient history, and epidemiological information. The expected result is Negative.  Fact Sheet for Patients:  EntrepreneurPulse.com.au  Fact Sheet for Healthcare Providers:  IncredibleEmployment.be  This test is no t yet approved or cleared by the [place] FDA and  has been authorized for detection and/or diagnosis of SARS-CoV-2 by FDA under an Emergency Use Authorization (EUA). This EUA will remain  in effect (meaning this test can be used) for the duration of the COVID-19 declaration under Section 564(b)(1) of the Act, 21 U.S.C.section 360bbb-3(b)(1), unless the authorization is terminated  or revoked sooner.       Influenza A by PCR NEGATIVE NEGATIVE Final   Influenza B by PCR NEGATIVE NEGATIVE Final    Comment: (NOTE) The Xpert Xpress SARS-CoV-2/FLU/RSV plus assay is intended as an aid in the diagnosis of influenza from Nasopharyngeal swab specimens and should not be used as a sole basis for treatment. Nasal washings and aspirates are unacceptable for Xpert Xpress SARS-CoV-2/FLU/RSV testing.  Fact Sheet for Patients: EntrepreneurPulse.com.au  Fact Sheet for Healthcare Providers: IncredibleEmployment.be  This test is not yet approved or cleared by the [place] FDA and has been authorized for detection and/or diagnosis of SARS-CoV-2 by FDA under  an Emergency Use Authorization (EUA). This EUA will remain in effect (meaning this test can be used) for the duration of the COVID-19 declaration under Section 564(b)(1) of the Act, 21 U.S.C. section 360bbb-3(b)(1), unless the authorization is terminated or revoked.     Resp Syncytial Virus by PCR NEGATIVE NEGATIVE Final    Comment: (NOTE) Fact Sheet for Patients: EntrepreneurPulse.com.au  Fact Sheet for Healthcare Providers: IncredibleEmployment.be  This test is not yet approved or cleared by the [place] FDA and has been authorized for detection and/or diagnosis of SARS-CoV-2 by FDA under an Emergency Use Authorization (EUA). This EUA will remain in effect (meaning this test can be used) for the duration of the COVID-19 declaration under Section 564(b)(1) of the Act, 21 U.S.C. section 360bbb-3(b)(1), unless the authorization is terminated or revoked.  Performed at [facility], [place] [address], [place], [place] [address]   Respiratory (~20 pathogens) panel by PCR     Status: None   Collection Time: 08/02/22 12:06 PM   Specimen: Nasopharyngeal Swab; Respiratory  Result Value Ref Range Status   Adenovirus NOT DETECTED NOT DETECTED Final   Coronavirus 229E NOT DETECTED NOT DETECTED Final    Comment: (NOTE) The Coronavirus on the Respiratory Panel, DOES NOT test for the novel  Coronavirus (2019 nCoV)    Coronavirus HKU1 NOT DETECTED NOT DETECTED Final   Coronavirus NL63 NOT DETECTED NOT DETECTED Final   Coronavirus OC43 NOT DETECTED NOT DETECTED Final   Metapneumovirus NOT DETECTED NOT DETECTED Final   Rhinovirus / Enterovirus NOT DETECTED NOT DETECTED Final   Influenza A NOT DETECTED NOT DETECTED Final   Influenza B NOT DETECTED NOT DETECTED Final   Parainfluenza Virus 1 NOT DETECTED NOT DETECTED Final   Parainfluenza Virus 2 NOT DETECTED NOT DETECTED Final   Parainfluenza Virus 3 NOT DETECTED NOT DETECTED Final    Parainfluenza Virus 4 NOT DETECTED NOT DETECTED Final   Respiratory Syncytial Virus NOT DETECTED NOT DETECTED Final   Bordetella pertussis NOT DETECTED NOT DETECTED Final   Bordetella Parapertussis NOT DETECTED NOT DETECTED Final   Chlamydophila pneumoniae NOT DETECTED NOT DETECTED Final   Mycoplasma pneumoniae NOT DETECTED NOT DETECTED Final    Comment: Performed at [facility], [place]. [address] [place], [place] [address]  Blood Culture (routine x 2)     Status: None (Preliminary result)   Collection Time: 08/02/22  6:37 PM   Specimen: BLOOD  Result Value Ref Range Status   Specimen Description   Final    BLOOD SITE NOT SPECIFIED Performed at [facility] [address], [place], [place] [address]    Special Requests  Final    BOTTLES DRAWN AEROBIC AND ANAEROBIC Blood Culture adequate volume Performed at [facility] [address] [place], [place] [address]    Culture   Final    NO GROWTH < 12 HOURS Performed at [facility] [address] [place], [place] [address]    Report Status PENDING  Incomplete  Group A Strep by PCR     Status: Abnormal   Collection Time: 08/03/22  8:42 AM   Specimen: Throat; Sterile Swab  Result Value Ref Range Status   Group A Strep by PCR DETECTED (A) NOT DETECTED Final    Comment: Performed at [facility], [place] [address], [place],  [address]     Labs: BNP (last 3 results) No results for input(s): "BNP" in the last 8760 hours. Basic Metabolic Panel: Recent Labs  Lab 08/02/22 1705 08/02/22 1946 08/03/22 0426  NA 136  --  137  K 2.8*  --  3.4*  CL 103  --  106  CO2 24  --  25  GLUCOSE 110*  --  127*  BUN 10  --  8  CREATININE 0.55  --  0.55  CALCIUM 8.6*  --  8.2*  MG  --  1.6* 2.2   Liver Function Tests: Recent Labs  Lab 08/02/22 1705  AST 17  ALT 19  ALKPHOS 47  BILITOT 0.5  PROT 7.6  ALBUMIN 3.9   No results for input(s): "LIPASE", "AMYLASE"  in the last 168 hours. No results for input(s): "AMMONIA" in the last 168 hours. CBC: Recent Labs  Lab 08/02/22 1705 08/03/22 0426  WBC 17.5* 16.6*  NEUTROABS 14.2*  --   HGB 12.2 10.9*  HCT 37.8 34.7*  MCV 85.3 88.1  PLT 308 270   Cardiac Enzymes: No results for input(s): "CKTOTAL", "CKMB", "CKMBINDEX", "TROPONINI" in the last 168 hours. BNP: Invalid input(s): "POCBNP" CBG: No results for input(s): "GLUCAP" in the last 168 hours. D-Dimer No results for input(s): "DDIMER" in the last 72 hours. Hgb A1c No results for input(s): "HGBA1C" in the last 72 hours. Lipid Profile No results for input(s): "CHOL", "HDL", "LDLCALC", "TRIG", "CHOLHDL", "LDLDIRECT" in the last 72 hours. Thyroid function studies Recent Labs    08/02/22 2346  TSH 1.281   Anemia work up No results for input(s): "VITAMINB12", "FOLATE", "FERRITIN", "TIBC", "IRON", "RETICCTPCT" in the last 72 hours. Urinalysis    Component Value Date/Time   COLORURINE STRAW (A) 08/02/2022 1847   APPEARANCEUR CLEAR 08/02/2022 1847   LABSPEC 1.003 (L) 08/02/2022 1847   PHURINE 6.0 08/02/2022 1847   GLUCOSEU NEGATIVE 08/02/2022 1847   HGBUR SMALL (A) 08/02/2022 1847   BILIRUBINUR NEGATIVE 08/02/2022 1847   KETONESUR NEGATIVE 08/02/2022 1847   PROTEINUR NEGATIVE 08/02/2022 1847   UROBILINOGEN 0.2 08/26/2011 1559   NITRITE NEGATIVE 08/02/2022 1847   LEUKOCYTESUR NEGATIVE 08/02/2022 1847   Sepsis Labs Recent Labs  Lab 08/02/22 1705 08/03/22 0426  WBC 17.5* 16.6*   Microbiology Recent Results (from the past 240 hour(s))  Resp panel by RT-PCR (RSV, Flu A&B, Covid) Anterior Nasal Swab     Status: None   Collection Time: 08/02/22 12:06 PM   Specimen: Anterior Nasal Swab  Result Value Ref Range Status   SARS Coronavirus 2 by RT PCR NEGATIVE NEGATIVE Final    Comment: (NOTE) SARS-CoV-2 target nucleic acids are NOT DETECTED.  The SARS-CoV-2 RNA is generally detectable in upper respiratory specimens during the acute  phase of infection. The lowest concentration  of SARS-CoV-2 viral copies this assay can detect is 138 copies/mL. A negative result does not preclude SARS-Cov-2 infection and should not be used as the sole basis for treatment or other patient management decisions. A negative result may occur with  improper specimen collection/handling, submission of specimen other than nasopharyngeal swab, presence of viral mutation(s) within the areas targeted by this assay, and inadequate number of viral copies(<138 copies/mL). A negative result must be combined with clinical observations, patient history, and epidemiological information. The expected result is Negative.  Fact Sheet for Patients:  EntrepreneurPulse.com.au  Fact Sheet for Healthcare Providers:  IncredibleEmployment.be  This test is no t yet approved or cleared by the [place] FDA and  has been authorized for detection and/or diagnosis of SARS-CoV-2 by FDA under an Emergency Use Authorization (EUA). This EUA will remain  in effect (meaning this test can be used) for the duration of the COVID-19 declaration under Section 564(b)(1) of the Act, 21 U.S.C.section 360bbb-3(b)(1), unless the authorization is terminated  or revoked sooner.       Influenza A by PCR NEGATIVE NEGATIVE Final   Influenza B by PCR NEGATIVE NEGATIVE Final    Comment: (NOTE) The Xpert Xpress SARS-CoV-2/FLU/RSV plus assay is intended as an aid in the diagnosis of influenza from Nasopharyngeal swab specimens and should not be used as a sole basis for treatment. Nasal washings and aspirates are unacceptable for Xpert Xpress SARS-CoV-2/FLU/RSV testing.  Fact Sheet for Patients: EntrepreneurPulse.com.au  Fact Sheet for Healthcare Providers: IncredibleEmployment.be  This test is not yet approved or cleared by the [place] FDA and has been authorized for detection and/or diagnosis of  SARS-CoV-2 by FDA under an Emergency Use Authorization (EUA). This EUA will remain in effect (meaning this test can be used) for the duration of the COVID-19 declaration under Section 564(b)(1) of the Act, 21 U.S.C. section 360bbb-3(b)(1), unless the authorization is terminated or revoked.     Resp Syncytial Virus by PCR NEGATIVE NEGATIVE Final    Comment: (NOTE) Fact Sheet for Patients: EntrepreneurPulse.com.au  Fact Sheet for Healthcare Providers: IncredibleEmployment.be  This test is not yet approved or cleared by the [place] FDA and has been authorized for detection and/or diagnosis of SARS-CoV-2 by FDA under an Emergency Use Authorization (EUA). This EUA will remain in effect (meaning this test can be used) for the duration of the COVID-19 declaration under Section 564(b)(1) of the Act, 21 U.S.C. section 360bbb-3(b)(1), unless the authorization is terminated or revoked.  Performed at [facility], [place] [address], [place], [place] [address]   Respiratory (~20 pathogens) panel by PCR     Status: None   Collection Time: 08/02/22 12:06 PM   Specimen: Nasopharyngeal Swab; Respiratory  Result Value Ref Range Status   Adenovirus NOT DETECTED NOT DETECTED Final   Coronavirus 229E NOT DETECTED NOT DETECTED Final    Comment: (NOTE) The Coronavirus on the Respiratory Panel, DOES NOT test for the novel  Coronavirus (2019 nCoV)    Coronavirus HKU1 NOT DETECTED NOT DETECTED Final   Coronavirus NL63 NOT DETECTED NOT DETECTED Final   Coronavirus OC43 NOT DETECTED NOT DETECTED Final   Metapneumovirus NOT DETECTED NOT DETECTED Final   Rhinovirus / Enterovirus NOT DETECTED NOT DETECTED Final   Influenza A NOT DETECTED NOT DETECTED Final   Influenza B NOT DETECTED NOT DETECTED Final   Parainfluenza Virus 1 NOT DETECTED NOT DETECTED Final   Parainfluenza Virus 2 NOT DETECTED NOT DETECTED Final   Parainfluenza Virus 3 NOT  DETECTED NOT DETECTED Final   Parainfluenza Virus 4 NOT DETECTED NOT DETECTED Final   Respiratory Syncytial Virus NOT DETECTED NOT DETECTED Final   Bordetella pertussis NOT DETECTED NOT DETECTED Final   Bordetella Parapertussis NOT DETECTED NOT DETECTED Final   Chlamydophila pneumoniae NOT DETECTED NOT DETECTED Final   Mycoplasma pneumoniae NOT DETECTED NOT DETECTED Final    Comment: Performed at [facility], [place] [address], [place], [place] [address]  Blood Culture (routine x 2)     Status: None (Preliminary result)   Collection Time: 08/02/22  6:37 PM   Specimen: BLOOD  Result Value Ref Range Status   Specimen Description   Final    BLOOD SITE NOT SPECIFIED Performed at [facility] [address] [place], [place] [address]    Special Requests   Final    BOTTLES DRAWN AEROBIC AND ANAEROBIC Blood Culture adequate volume Performed at [facility] [address], [place], [place] [address]    Culture   Final    NO GROWTH < 12 HOURS Performed at [facility] [address], [place], [place] [address]    Report Status PENDING  Incomplete  Group A Strep by PCR     Status: Abnormal   Collection Time: 08/03/22  8:42 AM   Specimen: Throat; Sterile Swab  Result Value Ref Range Status   Group A Strep by PCR DETECTED (A) NOT DETECTED Final    Comment: Performed at [facility], [place] [address], [place], [place] [address]     Time coordinating discharge:  35 minutes  SIGNED:   Barb Merino, MD  [facility] 08/03/2022, 1:46 PM

## 2022-08-03 NOTE — ED Notes (Signed)
Pt c/o sore throat.  RN offered a ice pop which pt gladly took.

## 2022-08-04 LAB — URINE CULTURE: Culture: NO GROWTH

## 2022-08-07 LAB — CULTURE, BLOOD (ROUTINE X 2)
Culture: NO GROWTH
Special Requests: ADEQUATE

## 2022-12-13 ENCOUNTER — Ambulatory Visit: Payer: BC Managed Care – PPO | Attending: Interventional Cardiology | Admitting: Interventional Cardiology

## 2022-12-14 ENCOUNTER — Encounter: Payer: Self-pay | Admitting: Interventional Cardiology

## 2023-06-10 ENCOUNTER — Other Ambulatory Visit: Payer: Self-pay | Admitting: Obstetrics and Gynecology

## 2023-06-10 ENCOUNTER — Encounter: Payer: Self-pay | Admitting: Obstetrics and Gynecology

## 2023-06-10 DIAGNOSIS — N368 Other specified disorders of urethra: Secondary | ICD-10-CM

## 2023-06-15 ENCOUNTER — Encounter: Payer: Self-pay | Admitting: Obstetrics and Gynecology

## 2023-06-29 ENCOUNTER — Ambulatory Visit
Admission: RE | Admit: 2023-06-29 | Discharge: 2023-06-29 | Disposition: A | Discharge status: Home / Self Care | Payer: BC Managed Care – PPO | Source: Ambulatory Visit | Attending: Obstetrics and Gynecology | Admitting: Obstetrics and Gynecology

## 2023-06-29 DIAGNOSIS — N368 Other specified disorders of urethra: Secondary | ICD-10-CM

## 2023-06-29 MED ORDER — GADOPICLENOL 0.5 MMOL/ML IV SOLN
10.0000 mL | Freq: Once | INTRAVENOUS | Status: AC | PRN
  Administered 2023-06-29: 10 mL via INTRAVENOUS

## 2023-09-30 ENCOUNTER — Other Ambulatory Visit: Payer: Self-pay | Admitting: Urology

## 2024-01-08 NOTE — Patient Instructions (Signed)
 SURGICAL WAITING ROOM VISITATION Patients having surgery or a procedure may have no more than 2 support people in the waiting area - these visitors may rotate in the visitor waiting room.   If the patient needs to stay at the hospital during part of their recovery, the visitor guidelines for inpatient rooms apply.  PRE-OP VISITATION  Pre-op nurse will coordinate an appropriate time for 1 support person to accompany the patient in pre-op.  This support person may not rotate.  This visitor will be contacted when the time is appropriate for the visitor to come back in the pre-op area.  Please refer to the [facility] website for the visitor guidelines for Inpatients (after your surgery is over and you are in a regular room).  You are not required to quarantine at this time prior to your surgery. However, you must do this: Hand Hygiene often Do NOT share personal items Notify your provider if you are in close contact with someone who has COVID or you develop fever 100.4 or greater, new onset of sneezing, cough, sore throat, shortness of breath or body aches.  If you test positive for Covid or have been in contact with anyone that has tested positive in the last 10 days please notify you surgeon.    Your procedure is scheduled on:  Friday   January 20, 2024  Report to [facility] Main Entrance: [address] entrance where the [employer] is available.   Report to admitting at:   05:15  AM  Call this number if you have any questions or problems the morning of surgery [phone]  DO NOT EAT OR DRINK ANYTHING AFTER MIDNIGHT THE NIGHT PRIOR TO YOUR SURGERY / PROCEDURE.   FOLLOW  ANY ADDITIONAL PRE OP INSTRUCTIONS YOU RECEIVED FROM YOUR SURGEON'S OFFICE!!!  FLEET ENEMA: Obtain one(1) Fleet Enema (sodium phosphate  7-19 gm / 118 ml enema) and use (according to the directions on the box) the morning of your surgery.   If you have any questions, please contact your Surgeon's office for  additional information.    Oral Hygiene is also important to reduce your risk of infection.        Remember - BRUSH YOUR TEETH THE MORNING OF SURGERY WITH YOUR REGULAR TOOTHPASTE  Do NOT smoke after Midnight the night before surgery.  STOP TAKING all Vitamins, Herbs and supplements 1 week before your surgery.   Take ONLY these medicines the morning of surgery with A SIP OF WATER: Mirabegron (Myrbetriq),omeprazole, Lexapro , levothyroxine , and Tylenol  if needed for pain. You may use your Flonase  nasal spray and Albuterol inhaler if needed. Please bring your Albuterol inhaler with you on the day of surgery.    You may not have any metal on your body including hair pins, jewelry, and body piercing  Do not wear make-up, lotions, powders, perfumes  or deodorant  Do not wear nail polish including gel and S&S, artificial / acrylic nails, or any other type of covering on natural nails including finger and toenails. If you have artificial nails, gel coating, etc., that needs to be removed by a nail salon, Please have this removed prior to surgery. Not doing so may mean that your surgery could be cancelled or delayed if the Surgeon or anesthesia staff feels like they are unable to monitor you safely.   Do not shave 48 hours prior to surgery to avoid nicks in your skin which may contribute to postoperative infections.   Contacts, Hearing Aids, dentures or bridgework may not  be worn into surgery. DENTURES WILL BE REMOVED PRIOR TO SURGERY PLEASE DO NOT APPLY "Poly grip" OR ADHESIVES!!!  You may bring a small overnight bag with you on the day of surgery, only pack items that are not valuable. [facility] IS NOT RESPONSIBLE   FOR VALUABLES THAT ARE LOST OR STOLEN.   Do not bring your home medications to the hospital. The Pharmacy will dispense medications listed on your medication list to you during your admission in the Hospital.  Special Instructions: Bring a copy of your healthcare power of attorney and  living will documents the day of surgery, if you wish to have them scanned into your [facility] Records- EPIC  Please read over the following fact sheets you were given: IF YOU HAVE QUESTIONS ABOUT YOUR PRE-OP INSTRUCTIONS, PLEASE CALL [phone].   [place] - Preparing for Surgery Before surgery, you can play an important role.  Because skin is not sterile, your skin needs to be as free of germs as possible.  You can reduce the number of germs on your skin by washing with CHG (chlorahexidine gluconate) soap before surgery.  CHG is an antiseptic cleaner which kills germs and bonds with the skin to continue killing germs even after washing. Please DO NOT use if you have an allergy to CHG or antibacterial soaps.  If your skin becomes reddened/irritated stop using the CHG and inform your nurse when you arrive at Short Stay. Do not shave (including legs and underarms) for at least 48 hours prior to the first CHG shower.  You may shave your face/neck.  Please follow these instructions carefully:  1.  Shower with CHG Soap the night before surgery and the  morning of surgery.  2.  If you choose to wash your hair, wash your hair first as usual with your normal  shampoo.  3.  After you shampoo, rinse your hair and body thoroughly to remove the shampoo.                             4.  Use CHG as you would any other liquid soap.  You can apply chg directly to the skin and wash.  Gently with a scrungie or clean washcloth.  5.  Apply the CHG Soap to your body ONLY FROM THE NECK DOWN.   Do not use on face/ open                           Wound or open sores. Avoid contact with eyes, ears mouth and genitals (private parts).                       Wash face,  Genitals (private parts) with your normal soap.             6.  Wash thoroughly, paying special attention to the area where your  surgery  will be performed.  7.  Thoroughly rinse your body with warm water from the neck down.  8.  DO NOT  shower/wash with your normal soap after using and rinsing off the CHG Soap.            9.  Pat yourself dry with a clean towel.            10.  Wear clean pajamas.            11.  Place clean sheets  on your bed the night of your first shower and do not  sleep with pets.  ON THE DAY OF SURGERY : Do not apply any lotions/deodorants the morning of surgery.  Please wear clean clothes to the hospital/surgery center.    FAILURE TO FOLLOW THESE INSTRUCTIONS MAY RESULT IN THE CANCELLATION OF YOUR SURGERY  PATIENT SIGNATURE_________________________________  NURSE SIGNATURE__________________________________  ________________________________________________________________________         WHAT IS A BLOOD TRANSFUSION? Blood Transfusion Information  A transfusion is the replacement of blood or some of its parts. Blood is made up of multiple cells which provide different functions. Red blood cells carry oxygen and are used for blood loss replacement. White blood cells fight against infection. Platelets control bleeding. Plasma helps clot blood. Other blood products are available for specialized needs, such as hemophilia or other clotting disorders. BEFORE THE TRANSFUSION  Who gives blood for transfusions?  Healthy volunteers who are fully evaluated to make sure their blood is safe. This is blood bank blood. Transfusion therapy is the safest it has ever been in the practice of medicine. Before blood is taken from a donor, a complete history is taken to make sure that person has no history of diseases nor engages in risky social behavior (examples are intravenous drug use or sexual activity with multiple partners). The donor's travel history is screened to minimize risk of transmitting infections, such as malaria. The donated blood is tested for signs of infectious diseases, such as HIV and hepatitis. The blood is then tested to be sure it is compatible with you in order to minimize the chance of  a transfusion reaction. If you or a relative donates blood, this is often done in anticipation of surgery and is not appropriate for emergency situations. It takes many days to process the donated blood. RISKS AND COMPLICATIONS Although transfusion therapy is very safe and saves many lives, the main dangers of transfusion include:  Getting an infectious disease. Developing a transfusion reaction. This is an allergic reaction to something in the blood you were given. Every precaution is taken to prevent this. The decision to have a blood transfusion has been considered carefully by your caregiver before blood is given. Blood is not given unless the benefits outweigh the risks. AFTER THE TRANSFUSION Right after receiving a blood transfusion, you will usually feel much better and more energetic. This is especially true if your red blood cells have gotten low (anemic). The transfusion raises the level of the red blood cells which carry oxygen, and this usually causes an energy increase. The nurse administering the transfusion will monitor you carefully for complications. HOME CARE INSTRUCTIONS  No special instructions are needed after a transfusion. You may find your energy is better. Speak with your caregiver about any limitations on activity for underlying diseases you may have. SEEK MEDICAL CARE IF:  Your condition is not improving after your transfusion. You develop redness or irritation at the intravenous (IV) site. SEEK IMMEDIATE MEDICAL CARE IF:  Any of the following symptoms occur over the next 12 hours: Shaking chills. You have a temperature by mouth above 102 F (38.9 C), not controlled by medicine. Chest, back, or muscle pain. People around you feel you are not acting correctly or are confused. Shortness of breath or difficulty breathing. Dizziness and fainting. You get a rash or develop hives. You have a decrease in urine output. Your urine turns a dark color or changes to pink, red,  or brown. Any of the following symptoms occur  over the next 10 days: You have a temperature by mouth above 102 F (38.9 C), not controlled by medicine. Shortness of breath. Weakness after normal activity. The white part of the eye turns yellow (jaundice). You have a decrease in the amount of urine or are urinating less often. Your urine turns a dark color or changes to pink, red, or brown. Document Released: 07/30/2000 Document Revised: 10/25/2011 Document Reviewed: 03/18/2008 [facility] Patient Information 2014 [place], [place].  _______________________________________________________________________

## 2024-01-08 NOTE — Progress Notes (Signed)
 COVID Vaccine received:  []  No [x]  Yes Date of any COVID positive Test in last 90 days:  PCP - Aris Kuster, NP [phone] (Work)  [phone] (Fax)  Cardiologist -  none  Chest x-ray - 08-02-2022  1v  Epic EKG -  08-06-2022    will repeat Stress Test -  ECHO -  Cardiac Cath -  CT Coronary Calcium score:   Bowel Prep - []  No  [x]   Yes __1 Fleet Enema____  Pacemaker / ICD device [x]  No []  Yes   Spinal Cord Stimulator:[x]  No []  Yes       History of Sleep Apnea? []  No []  Yes   CPAP used?- []  No []  Yes    Does the patient monitor blood sugar?   []  N/A   []  No []  Yes  Patient has: []  NO Hx DM   [x]  Pre-DM   []  DM1  []   DM2 Last A1c was:  5.9 on  10-13-23     Blood Thinner / Instructions: none Aspirin Instructions:  none  ERAS Protocol Ordered: [x]  No  []  Yes Patient is to be NPO after: MN  Dental hx: []  Dentures:  []  N/A      []  Bridge or Partial:                   []  Loose or Damaged teeth:   Comments:   Activity level: Able to walk up 2 flights of stairs without becoming significantly short of breath or having chest pain?  []  No   []    Yes   Anesthesia review: GERD- hx upper and lower GI bleed, GAD, ADHD, asthma, anemia, hx tachycardia, hx phentermine use.  Patient denies shortness of breath, fever, cough and chest pain at PAT appointment.  Patient verbalized understanding and agreement to the Pre-Surgical Instructions that were given to them at this PAT appointment. Patient was also educated of the need to review these PAT instructions again prior to her surgery.I reviewed the appropriate phone numbers to call if they have any and questions or concerns.

## 2024-01-10 ENCOUNTER — Encounter (HOSPITAL_COMMUNITY)
Admission: RE | Admit: 2024-01-10 | Discharge: 2024-01-10 | Disposition: A | Discharge status: Home / Self Care | Source: Ambulatory Visit | Attending: Urology | Admitting: Urology

## 2024-01-10 VITALS — BP 119/83 | HR 84 | Temp 98.4°F | Resp 18 | Ht 61.0 in | Wt 205.0 lb

## 2024-01-10 DIAGNOSIS — F909 Attention-deficit hyperactivity disorder, unspecified type: Secondary | ICD-10-CM

## 2024-01-10 DIAGNOSIS — R7303 Prediabetes: Secondary | ICD-10-CM | POA: Insufficient documentation

## 2024-01-10 DIAGNOSIS — Z01818 Encounter for other preprocedural examination: Secondary | ICD-10-CM | POA: Diagnosis present

## 2024-01-10 DIAGNOSIS — T505X5S Adverse effect of appetite depressants, sequela: Secondary | ICD-10-CM | POA: Insufficient documentation

## 2024-01-10 LAB — CBC
HCT: 42.4 % (ref 36.0–46.0)
Hemoglobin: 13.2 g/dL (ref 12.0–15.0)
MCH: 27 pg (ref 26.0–34.0)
MCHC: 31.1 g/dL (ref 30.0–36.0)
MCV: 86.9 fL (ref 80.0–100.0)
Platelets: 345 10*3/uL (ref 150–400)
RBC: 4.88 MIL/uL (ref 3.87–5.11)
RDW: 13.9 % (ref 11.5–15.5)
WBC: 10.4 10*3/uL (ref 4.0–10.5)
nRBC: 0 % (ref 0.0–0.2)

## 2024-01-10 LAB — COMPREHENSIVE METABOLIC PANEL WITH GFR
ALT: 93 U/L — ABNORMAL HIGH (ref 0–44)
AST: 42 U/L — ABNORMAL HIGH (ref 15–41)
Albumin: 4 g/dL (ref 3.5–5.0)
Alkaline Phosphatase: 79 U/L (ref 38–126)
Anion gap: 9 (ref 5–15)
BUN: 12 mg/dL (ref 6–20)
CO2: 27 mmol/L (ref 22–32)
Calcium: 9.4 mg/dL (ref 8.9–10.3)
Chloride: 101 mmol/L (ref 98–111)
Creatinine, Ser: 0.73 mg/dL (ref 0.44–1.00)
GFR, Estimated: >60 mL/min (ref >60)
Glucose, Bld: 97 mg/dL (ref 70–99)
Potassium: 3.4 mmol/L — ABNORMAL LOW (ref 3.5–5.1)
Sodium: 137 mmol/L (ref 135–145)
Total Bilirubin: 0.5 mg/dL (ref 0.0–1.2)
Total Protein: 7.7 g/dL (ref 6.5–8.1)

## 2024-01-11 LAB — URINE CULTURE: Culture: NO GROWTH

## 2024-01-19 NOTE — Anesthesia Preprocedure Evaluation (Signed)
 Anesthesia Evaluation  Patient identified by MRN, date of birth, ID band Patient awake    Reviewed: Allergy & Precautions, NPO status , Patient's Chart, lab work & pertinent test results  History of Anesthesia Complications Negative for: history of anesthetic complications  Airway Mallampati: I  TM Distance: >3 FB Neck ROM: Full    Dental  (+) Dental Advisory Given   Pulmonary asthma , COPD,  COPD inhaler   breath sounds clear to auscultation       Cardiovascular negative cardio ROS  Rhythm:Regular Rate:Normal     Neuro/Psych   Anxiety Depression    Chronic back pain    GI/Hepatic Neg liver ROS,GERD  Medicated and Controlled,,  Endo/Other  Hypothyroidism  BMI 38  Renal/GU negative Renal ROS     Musculoskeletal   Abdominal   Peds  Hematology Hb 13.2, plt 345k   Anesthesia Other Findings   Reproductive/Obstetrics                             Anesthesia Physical Anesthesia Plan  ASA: 3  Anesthesia Plan: General   Post-op Pain Management: Tylenol  PO (pre-op)*   Induction: Intravenous  PONV Risk Score and Plan: Ondansetron , Dexamethasone  and Scopolamine patch - Pre-op  Airway Management Planned: Oral ETT  Additional Equipment: None  Intra-op Plan:   Post-operative Plan: Extubation in OR  Informed Consent: I have reviewed the patients History and Physical, chart, labs and discussed the procedure including the risks, benefits and alternatives for the proposed anesthesia with the patient or authorized representative who has indicated his/her understanding and acceptance.     Dental advisory given  Plan Discussed with: CRNA and Surgeon  Anesthesia Plan Comments:         Anesthesia Quick Evaluation

## 2024-01-20 ENCOUNTER — Encounter (HOSPITAL_COMMUNITY)
Admission: RE | Disposition: A | Discharge status: Home / Self Care | Payer: Self-pay | Source: Ambulatory Visit | Attending: Urology

## 2024-01-20 ENCOUNTER — Encounter (HOSPITAL_COMMUNITY): Payer: Self-pay | Admitting: Urology

## 2024-01-20 ENCOUNTER — Ambulatory Visit (HOSPITAL_COMMUNITY): Payer: Self-pay | Admitting: Anesthesiology

## 2024-01-20 ENCOUNTER — Observation Stay (HOSPITAL_COMMUNITY)
Admission: RE | Admit: 2024-01-20 | Discharge: 2024-01-21 | Disposition: A | Discharge status: Home / Self Care | Payer: Self-pay | Source: Ambulatory Visit | Attending: Urology | Admitting: Urology

## 2024-01-20 ENCOUNTER — Other Ambulatory Visit: Payer: Self-pay

## 2024-01-20 DIAGNOSIS — N393 Stress incontinence (female) (male): Secondary | ICD-10-CM

## 2024-01-20 DIAGNOSIS — Z79899 Other long term (current) drug therapy: Secondary | ICD-10-CM | POA: Insufficient documentation

## 2024-01-20 DIAGNOSIS — N8189 Other female genital prolapse: Secondary | ICD-10-CM | POA: Insufficient documentation

## 2024-01-20 DIAGNOSIS — E039 Hypothyroidism, unspecified: Secondary | ICD-10-CM

## 2024-01-20 DIAGNOSIS — Z8616 Personal history of COVID-19: Secondary | ICD-10-CM | POA: Diagnosis not present

## 2024-01-20 DIAGNOSIS — N814 Uterovaginal prolapse, unspecified: Secondary | ICD-10-CM | POA: Diagnosis not present

## 2024-01-20 DIAGNOSIS — J449 Chronic obstructive pulmonary disease, unspecified: Secondary | ICD-10-CM

## 2024-01-20 HISTORY — PX: ROBOTIC ASSISTED LAPAROSCOPIC SACROCOLPOPEXY: SHX5388

## 2024-01-20 HISTORY — PX: PUBOVAGINAL SLING: SHX1035

## 2024-01-20 HISTORY — PX: ROBOTIC ASSISTED SUPRACERVICAL HYSTERECTOMY WITH BILATERAL SALPINGO OOPHERECTOMY: SHX6084

## 2024-01-20 LAB — BASIC METABOLIC PANEL WITH GFR
Anion gap: 14 (ref 5–15)
BUN: 12 mg/dL (ref 6–20)
CO2: 22 mmol/L (ref 22–32)
Calcium: 8.7 mg/dL — ABNORMAL LOW (ref 8.9–10.3)
Chloride: 102 mmol/L (ref 98–111)
Creatinine, Ser: 0.69 mg/dL (ref 0.44–1.00)
GFR, Estimated: >60 mL/min (ref >60)
Glucose, Bld: 144 mg/dL — ABNORMAL HIGH (ref 70–99)
Potassium: 3.4 mmol/L — ABNORMAL LOW (ref 3.5–5.1)
Sodium: 138 mmol/L (ref 135–145)

## 2024-01-20 LAB — HEMOGLOBIN AND HEMATOCRIT, BLOOD
HCT: 39.3 % (ref 36.0–46.0)
Hemoglobin: 12.4 g/dL (ref 12.0–15.0)

## 2024-01-20 LAB — TYPE AND SCREEN
ABO/RH(D): O POS
Antibody Screen: NEGATIVE

## 2024-01-20 SURGERY — SACROCOLPOPEXY, ROBOT-ASSISTED, LAPAROSCOPIC
Anesthesia: General | Site: Abdomen

## 2024-01-20 MED ORDER — PHENYLEPHRINE HCL-NACL 20-0.9 MG/250ML-% IV SOLN
INTRAVENOUS | Status: DC | PRN
Start: 2024-01-20 — End: 2024-01-20
  Administered 2024-01-20: 30 ug/min via INTRAVENOUS

## 2024-01-20 MED ORDER — DIPHENHYDRAMINE HCL 50 MG/ML IJ SOLN: 12.5000 mg | Freq: Four times a day (QID) | INTRAMUSCULAR | Status: DC | PRN

## 2024-01-20 MED ORDER — ONDANSETRON HCL 4 MG/2ML IJ SOLN
INTRAMUSCULAR | Status: AC
  Filled 2024-01-20: qty 2

## 2024-01-20 MED ORDER — ONDANSETRON HCL 4 MG/2ML IJ SOLN: 4.0000 mg | INTRAMUSCULAR | Status: DC | PRN

## 2024-01-20 MED ORDER — SUGAMMADEX SODIUM 200 MG/2ML IV SOLN
INTRAVENOUS | Status: DC | PRN
Start: 2024-01-20 — End: 2024-01-20
  Administered 2024-01-20: 200 mg via INTRAVENOUS

## 2024-01-20 MED ORDER — ACETAMINOPHEN 10 MG/ML IV SOLN
1000.0000 mg | Freq: Four times a day (QID) | INTRAVENOUS | Status: DC
  Administered 2024-01-20 – 2024-01-21 (×3): 1000 mg via INTRAVENOUS
  Filled 2024-01-20 (×3): qty 100

## 2024-01-20 MED ORDER — BUPIVACAINE-EPINEPHRINE (PF) 0.5% -1:200000 IJ SOLN
INTRAMUSCULAR | Status: AC
  Filled 2024-01-20: qty 30

## 2024-01-20 MED ORDER — BUPIVACAINE LIPOSOME 1.3 % IJ SUSP
INTRAMUSCULAR | Status: AC
  Filled 2024-01-20: qty 20

## 2024-01-20 MED ORDER — PHENYLEPHRINE 80 MCG/ML (10ML) SYRINGE FOR IV PUSH (FOR BLOOD PRESSURE SUPPORT): PREFILLED_SYRINGE | INTRAVENOUS | Status: DC | PRN

## 2024-01-20 MED ORDER — ROCURONIUM BROMIDE 100 MG/10ML IV SOLN
INTRAVENOUS | Status: DC | PRN
Start: 2024-01-20 — End: 2024-01-20
  Administered 2024-01-20: 30 mg via INTRAVENOUS
  Administered 2024-01-20: 20 mg via INTRAVENOUS
  Administered 2024-01-20: 60 mg via INTRAVENOUS

## 2024-01-20 MED ORDER — HYOSCYAMINE SULFATE 0.125 MG SL SUBL
0.1250 mg | SUBLINGUAL_TABLET | SUBLINGUAL | Status: DC | PRN
  Administered 2024-01-20 – 2024-01-21 (×2): 0.125 mg via SUBLINGUAL
  Filled 2024-01-20 (×2): qty 1

## 2024-01-20 MED ORDER — LACTATED RINGERS IV SOLN
INTRAVENOUS | Status: DC | PRN
Start: 2024-01-20 — End: 2024-01-20

## 2024-01-20 MED ORDER — PHENYLEPHRINE HCL (PRESSORS) 10 MG/ML IV SOLN
INTRAVENOUS | Status: AC
  Filled 2024-01-20: qty 1

## 2024-01-20 MED ORDER — ALBUTEROL SULFATE (2.5 MG/3ML) 0.083% IN NEBU: 3.0000 mL | INHALATION_SOLUTION | RESPIRATORY_TRACT | Status: DC | PRN

## 2024-01-20 MED ORDER — OXYCODONE HCL 5 MG PO TABS
5.0000 mg | ORAL_TABLET | Freq: Once | ORAL | Status: AC | PRN
  Administered 2024-01-20: 5 mg via ORAL

## 2024-01-20 MED ORDER — KETAMINE HCL 10 MG/ML IJ SOLN: INTRAMUSCULAR | Status: DC | PRN

## 2024-01-20 MED ORDER — CEFAZOLIN SODIUM-DEXTROSE 2-4 GM/100ML-% IV SOLN
2.0000 g | INTRAVENOUS | Status: AC
  Administered 2024-01-20 (×2): 2 g via INTRAVENOUS
  Filled 2024-01-20: qty 100

## 2024-01-20 MED ORDER — DOCUSATE SODIUM 100 MG PO CAPS: 100.0000 mg | ORAL_CAPSULE | Freq: Two times a day (BID) | ORAL | Status: AC

## 2024-01-20 MED ORDER — PROPOFOL 10 MG/ML IV BOLUS
INTRAVENOUS | Status: DC | PRN
  Administered 2024-01-20: 200 mg via INTRAVENOUS

## 2024-01-20 MED ORDER — HYDROMORPHONE HCL 1 MG/ML IJ SOLN: 0.2500 mg | INTRAMUSCULAR | Status: DC | PRN

## 2024-01-20 MED ORDER — DEXAMETHASONE SODIUM PHOSPHATE 10 MG/ML IJ SOLN
INTRAMUSCULAR | Status: AC
  Filled 2024-01-20: qty 1

## 2024-01-20 MED ORDER — PHENYLEPHRINE 80 MCG/ML (10ML) SYRINGE FOR IV PUSH (FOR BLOOD PRESSURE SUPPORT)
PREFILLED_SYRINGE | INTRAVENOUS | Status: AC
  Filled 2024-01-20: qty 10

## 2024-01-20 MED ORDER — SODIUM CHLORIDE 0.45 % IV SOLN: INTRAVENOUS | Status: DC

## 2024-01-20 MED ORDER — HYDROCODONE-ACETAMINOPHEN 5-325 MG PO TABS: 1.0000 | ORAL_TABLET | Freq: Four times a day (QID) | ORAL | 0 refills | Status: DC | PRN

## 2024-01-20 MED ORDER — EZETIMIBE 10 MG PO TABS
10.0000 mg | ORAL_TABLET | Freq: Every day | ORAL | Status: DC
  Administered 2024-01-20: 10 mg via ORAL
  Filled 2024-01-20: qty 1

## 2024-01-20 MED ORDER — SULFAMETHOXAZOLE-TRIMETHOPRIM 800-160 MG PO TABS: 1.0000 | ORAL_TABLET | Freq: Two times a day (BID) | ORAL | 0 refills | Status: DC

## 2024-01-20 MED ORDER — DEXAMETHASONE SODIUM PHOSPHATE 10 MG/ML IJ SOLN
INTRAMUSCULAR | Status: DC | PRN
  Administered 2024-01-20: 8 mg via INTRAVENOUS

## 2024-01-20 MED ORDER — BUPIVACAINE-EPINEPHRINE 0.5% -1:200000 IJ SOLN
INTRAMUSCULAR | Status: DC | PRN
  Administered 2024-01-20: 10 mL
  Administered 2024-01-20: 20 mL

## 2024-01-20 MED ORDER — KETAMINE HCL 50 MG/5ML IJ SOSY
PREFILLED_SYRINGE | INTRAMUSCULAR | Status: AC
Start: 2024-01-20 — End: ?
  Filled 2024-01-20: qty 5

## 2024-01-20 MED ORDER — FLEET ENEMA RE ENEM: 1.0000 | ENEMA | Freq: Once | RECTAL | Status: DC

## 2024-01-20 MED ORDER — 0.9 % SODIUM CHLORIDE (POUR BTL) OPTIME
TOPICAL | Status: DC | PRN
Start: 2024-01-20 — End: 2024-01-20
  Administered 2024-01-20: 1000 mL

## 2024-01-20 MED ORDER — MIDAZOLAM HCL 2 MG/2ML IJ SOLN: 0.5000 mg | Freq: Once | INTRAMUSCULAR | Status: DC | PRN

## 2024-01-20 MED ORDER — KETOROLAC TROMETHAMINE 15 MG/ML IJ SOLN
15.0000 mg | Freq: Four times a day (QID) | INTRAMUSCULAR | Status: DC
Start: 2024-01-20 — End: 2024-01-21
  Administered 2024-01-20 – 2024-01-21 (×4): 15 mg via INTRAVENOUS
  Filled 2024-01-20 (×4): qty 1

## 2024-01-20 MED ORDER — MUPIROCIN 2 % EX OINT
1.0000 | TOPICAL_OINTMENT | Freq: Two times a day (BID) | CUTANEOUS | Status: DC
  Administered 2024-01-20 – 2024-01-21 (×2): 1 via NASAL
  Filled 2024-01-20: qty 22

## 2024-01-20 MED ORDER — KETAMINE HCL 50 MG/5ML IJ SOSY
PREFILLED_SYRINGE | INTRAMUSCULAR | Status: DC | PRN
Start: 2024-01-20 — End: 2024-01-20
  Administered 2024-01-20: 20 mg via INTRAVENOUS
  Administered 2024-01-20: 10 mg via INTRAVENOUS
  Administered 2024-01-20: 20 mg via INTRAVENOUS

## 2024-01-20 MED ORDER — ROCURONIUM BROMIDE 10 MG/ML (PF) SYRINGE
PREFILLED_SYRINGE | INTRAVENOUS | Status: AC
  Filled 2024-01-20: qty 10

## 2024-01-20 MED ORDER — FENTANYL CITRATE (PF) 100 MCG/2ML IJ SOLN
INTRAMUSCULAR | Status: AC
  Filled 2024-01-20: qty 2

## 2024-01-20 MED ORDER — LIDOCAINE HCL (PF) 2 % IJ SOLN
INTRAMUSCULAR | Status: AC
  Filled 2024-01-20: qty 5

## 2024-01-20 MED ORDER — FENTANYL CITRATE (PF) 100 MCG/2ML IJ SOLN
INTRAMUSCULAR | Status: AC
Start: 2024-01-20 — End: ?
  Filled 2024-01-20: qty 2

## 2024-01-20 MED ORDER — SODIUM CHLORIDE 0.9 % IR SOLN
Status: DC | PRN
  Administered 2024-01-20: 1000 mL

## 2024-01-20 MED ORDER — PHENYLEPHRINE 80 MCG/ML (10ML) SYRINGE FOR IV PUSH (FOR BLOOD PRESSURE SUPPORT)
PREFILLED_SYRINGE | INTRAVENOUS | Status: AC
Start: 2024-01-20 — End: ?
  Filled 2024-01-20: qty 10

## 2024-01-20 MED ORDER — ESTRADIOL 0.1 MG/GM VA CREA
TOPICAL_CREAM | VAGINAL | Status: DC | PRN
Start: 2024-01-20 — End: 2024-01-20
  Administered 2024-01-20: 1 via VAGINAL

## 2024-01-20 MED ORDER — ONDANSETRON HCL 4 MG/2ML IJ SOLN
INTRAMUSCULAR | Status: DC | PRN
  Administered 2024-01-20: 4 mg via INTRAVENOUS

## 2024-01-20 MED ORDER — OXYCODONE HCL 5 MG PO TABS
ORAL_TABLET | ORAL | Status: AC
  Filled 2024-01-20: qty 1

## 2024-01-20 MED ORDER — LEVOTHYROXINE SODIUM 50 MCG PO TABS
50.0000 ug | ORAL_TABLET | Freq: Every day | ORAL | Status: DC
  Administered 2024-01-21: 50 ug via ORAL
  Filled 2024-01-20: qty 1

## 2024-01-20 MED ORDER — LACTATED RINGERS IR SOLN
Status: DC | PRN
Start: 2024-01-20 — End: 2024-01-20
  Administered 2024-01-20: 1000 mL

## 2024-01-20 MED ORDER — PANTOPRAZOLE SODIUM 40 MG PO TBEC
40.0000 mg | DELAYED_RELEASE_TABLET | Freq: Every day | ORAL | Status: DC
  Administered 2024-01-20 – 2024-01-21 (×2): 40 mg via ORAL
  Filled 2024-01-20 (×2): qty 1

## 2024-01-20 MED ORDER — ACETAMINOPHEN 500 MG PO TABS
1000.0000 mg | ORAL_TABLET | Freq: Once | ORAL | Status: AC
  Administered 2024-01-20: 500 mg via ORAL
  Filled 2024-01-20: qty 2

## 2024-01-20 MED ORDER — LIDOCAINE HCL (CARDIAC) PF 100 MG/5ML IV SOSY
PREFILLED_SYRINGE | INTRAVENOUS | Status: DC | PRN
  Administered 2024-01-20: 100 mg via INTRATRACHEAL

## 2024-01-20 MED ORDER — CHLORHEXIDINE GLUCONATE 0.12 % MT SOLN: 15.0000 mL | Freq: Once | OROMUCOSAL | Status: DC

## 2024-01-20 MED ORDER — PROPOFOL 10 MG/ML IV BOLUS
INTRAVENOUS | Status: AC
  Filled 2024-01-20: qty 20

## 2024-01-20 MED ORDER — OXYCODONE HCL 5 MG PO TABS
5.0000 mg | ORAL_TABLET | ORAL | Status: DC | PRN
  Administered 2024-01-20 – 2024-01-21 (×2): 5 mg via ORAL
  Filled 2024-01-20 (×2): qty 1

## 2024-01-20 MED ORDER — ERYTHROMYCIN 5 MG/GM OP OINT
TOPICAL_OINTMENT | Freq: Four times a day (QID) | OPHTHALMIC | Status: DC
  Filled 2024-01-20: qty 1

## 2024-01-20 MED ORDER — SCOPOLAMINE 1 MG/3DAYS TD PT72
1.0000 | MEDICATED_PATCH | TRANSDERMAL | Status: DC
  Administered 2024-01-20: 1.5 mg via TRANSDERMAL
  Filled 2024-01-20: qty 1

## 2024-01-20 MED ORDER — POLYVINYL ALCOHOL 1.4 % OP SOLN
1.0000 [drp] | OPHTHALMIC | Status: DC | PRN
  Administered 2024-01-20: 1 [drp] via OPHTHALMIC
  Filled 2024-01-20: qty 15

## 2024-01-20 MED ORDER — BUPIVACAINE LIPOSOME 1.3 % IJ SUSP
INTRAMUSCULAR | Status: DC | PRN
Start: 2024-01-20 — End: 2024-01-20
  Administered 2024-01-20: 20 mL

## 2024-01-20 MED ORDER — HYDROMORPHONE HCL 1 MG/ML IJ SOLN
0.5000 mg | INTRAMUSCULAR | Status: DC | PRN
  Administered 2024-01-20 – 2024-01-21 (×2): 1 mg via INTRAVENOUS
  Filled 2024-01-20 (×3): qty 1

## 2024-01-20 MED ORDER — ESCITALOPRAM OXALATE 20 MG PO TABS
20.0000 mg | ORAL_TABLET | Freq: Every morning | ORAL | Status: DC
  Administered 2024-01-21: 20 mg via ORAL
  Filled 2024-01-20: qty 1

## 2024-01-20 MED ORDER — DOCUSATE SODIUM 100 MG PO CAPS
100.0000 mg | ORAL_CAPSULE | Freq: Two times a day (BID) | ORAL | Status: DC
  Administered 2024-01-20 – 2024-01-21 (×3): 100 mg via ORAL
  Filled 2024-01-20 (×3): qty 1

## 2024-01-20 MED ORDER — ESTRADIOL 0.1 MG/GM VA CREA
TOPICAL_CREAM | VAGINAL | Status: AC
  Filled 2024-01-20: qty 42.5

## 2024-01-20 MED ORDER — LIDOCAINE HCL (PF) 1 % IJ SOLN
INTRAMUSCULAR | Status: AC
Start: 2024-01-20 — End: ?
  Filled 2024-01-20: qty 30

## 2024-01-20 MED ORDER — DIPHENHYDRAMINE HCL 12.5 MG/5ML PO ELIX: 12.5000 mg | ORAL_SOLUTION | Freq: Four times a day (QID) | ORAL | Status: DC | PRN

## 2024-01-20 MED ORDER — MIDAZOLAM HCL 2 MG/2ML IJ SOLN
INTRAMUSCULAR | Status: AC
  Filled 2024-01-20: qty 2

## 2024-01-20 MED ORDER — LACTATED RINGERS IV SOLN: INTRAVENOUS | Status: DC

## 2024-01-20 MED ORDER — FENTANYL CITRATE (PF) 100 MCG/2ML IJ SOLN
INTRAMUSCULAR | Status: DC | PRN
  Administered 2024-01-20 (×3): 50 ug via INTRAVENOUS

## 2024-01-20 MED ORDER — MEPERIDINE HCL 50 MG/ML IJ SOLN: 6.2500 mg | INTRAMUSCULAR | Status: DC | PRN

## 2024-01-20 MED ORDER — CIPROFLOXACIN IN D5W 400 MG/200ML IV SOLN
400.0000 mg | INTRAVENOUS | Status: DC
  Filled 2024-01-20: qty 200

## 2024-01-20 MED ORDER — STERILE WATER FOR IRRIGATION IR SOLN
Status: DC | PRN
Start: 2024-01-20 — End: 2024-01-20
  Administered 2024-01-20: 1000 mL

## 2024-01-20 MED ORDER — OXYCODONE HCL 5 MG/5ML PO SOLN: 5.0000 mg | Freq: Once | ORAL | Status: AC | PRN

## 2024-01-20 MED ORDER — ORAL CARE MOUTH RINSE: 15.0000 mL | Freq: Once | OROMUCOSAL | Status: DC

## 2024-01-20 MED ORDER — CIPROFLOXACIN IN D5W 400 MG/200ML IV SOLN
INTRAVENOUS | Status: DC | PRN
Start: 2024-01-20 — End: 2024-01-20
  Administered 2024-01-20: 400 mg via INTRAVENOUS

## 2024-01-20 SURGICAL SUPPLY — 85 items
BAG COUNTER SPONGE SURGICOUNT (BAG) IMPLANT
BAG URINE DRAIN 2000ML AR STRL (UROLOGICAL SUPPLIES) IMPLANT
BLADE SURG 15 STRL LF DISP TIS (BLADE) ×6 IMPLANT
BRIEF MESH DISP LRG (UNDERPADS AND DIAPERS) IMPLANT
CATH FOLEY 2WAY SLVR 5CC 16FR (CATHETERS) ×3 IMPLANT
CHLORAPREP W/TINT 26 (MISCELLANEOUS) ×3 IMPLANT
CLIP LIGATING HEM O LOK PURPLE (MISCELLANEOUS) ×3 IMPLANT
CLIP LIGATING HEMO LOK XL GOLD (MISCELLANEOUS) IMPLANT
CLIP LIGATING HEMO O LOK GREEN (MISCELLANEOUS) IMPLANT
CLIP SUT LAPRA TY ABSORB (SUTURE) IMPLANT
COVER BACK TABLE 60X90IN (DRAPES) ×3 IMPLANT
COVER MAYO STAND STRL (DRAPES) ×3 IMPLANT
COVER SURGICAL LIGHT HANDLE (MISCELLANEOUS) ×3 IMPLANT
COVER TIP SHEARS 8 DVNC (MISCELLANEOUS) ×3 IMPLANT
DERMABOND ADVANCED .7 DNX12 (GAUZE/BANDAGES/DRESSINGS) ×3 IMPLANT
DRAPE ARM DVNC X/XI (DISPOSABLE) ×12 IMPLANT
DRAPE COLUMN DVNC XI (DISPOSABLE) ×3 IMPLANT
DRAPE INCISE IOBAN 66X45 STRL (DRAPES) ×3 IMPLANT
DRAPE SHEET LG 3/4 BI-LAMINATE (DRAPES) ×6 IMPLANT
DRAPE SURG IRRIG POUCH 19X23 (DRAPES) ×3 IMPLANT
DRAPE UNDERBUTTOCKS STRL (DISPOSABLE) ×3 IMPLANT
DRIVER NDL LRG 8 DVNC XI (INSTRUMENTS) ×6 IMPLANT
DRIVER NDLE LRG 8 DVNC XI (INSTRUMENTS) ×4 IMPLANT
ELECT PENCIL ROCKER SW 15FT (MISCELLANEOUS) ×3 IMPLANT
ELECT REM PT RETURN 15FT ADLT (MISCELLANEOUS) ×3 IMPLANT
FORCEPS BPLR LNG DVNC XI (INSTRUMENTS) ×3 IMPLANT
FORCEPS PROGRASP DVNC XI (FORCEP) ×3 IMPLANT
FORCEPS TENACULUM DVNC XI (FORCEP) ×3 IMPLANT
GAUZE 4X4 16PLY ~~LOC~~+RFID DBL (SPONGE) ×6 IMPLANT
GLOVE BIO SURGEON STRL SZ 6.5 (GLOVE) ×3 IMPLANT
GLOVE SURG LX STRL 7.5 STRW (GLOVE) ×6 IMPLANT
GOWN STRL REUS W/ TWL XL LVL3 (GOWN DISPOSABLE) ×6 IMPLANT
GOWN STRL SURGICAL XL XLNG (GOWN DISPOSABLE) ×3 IMPLANT
HOLDER FOLEY CATH W/STRAP (MISCELLANEOUS) ×3 IMPLANT
IRRIGATION SUCT STRKRFLW 2 WTP (MISCELLANEOUS) ×3 IMPLANT
KIT BASIN OR (CUSTOM PROCEDURE TRAY) ×3 IMPLANT
KIT TURNOVER KIT A (KITS) IMPLANT
MANIPULATOR UTERINE 4.5 ZUMI (MISCELLANEOUS) IMPLANT
MARKER SKIN DUAL TIP RULER LAB (MISCELLANEOUS) ×3 IMPLANT
NDL HYPO 22X1.5 SAFETY MO (MISCELLANEOUS) ×3 IMPLANT
NEEDLE HYPO 22X1.5 SAFETY MO (MISCELLANEOUS) ×2 IMPLANT
NS IRRIG 1000ML POUR BTL (IV SOLUTION) ×3 IMPLANT
OCCLUDER COLPOPNEUMO (BALLOONS) IMPLANT
PACK CYSTO (CUSTOM PROCEDURE TRAY) ×3 IMPLANT
PACKING VAGINAL (PACKING) IMPLANT
PAD OB MATERNITY 11 LF (PERSONAL CARE ITEMS) IMPLANT
PAD POSITIONING PINK XL (MISCELLANEOUS) ×3 IMPLANT
PLUG CATH AND CAP STRL 200 (CATHETERS) ×3 IMPLANT
RETRACTOR LONRSTAR 16.6X16.6CM (MISCELLANEOUS) ×3 IMPLANT
RETRACTOR STAY HOOK 5MM (MISCELLANEOUS) ×3 IMPLANT
SCISSORS MNPLR CVD DVNC XI (INSTRUMENTS) ×3 IMPLANT
SCRUB CHG 4% DYNA-HEX 4OZ (MISCELLANEOUS) IMPLANT
SEAL UNIV 5-12 XI (MISCELLANEOUS) ×12 IMPLANT
SET IRRIG Y TYPE TUR BLADDER L (SET/KITS/TRAYS/PACK) IMPLANT
SET TUBE SMOKE EVAC HIGH FLOW (TUBING) ×3 IMPLANT
SHEET LAVH (DRAPES) ×3 IMPLANT
SLING LYNX SUPRAPUBIC (Sling) ×3 IMPLANT
SLING LYNX SUPRAPUBIC BX (SLING) IMPLANT
SOL PREP POV-IOD 4OZ 10% (MISCELLANEOUS) ×3 IMPLANT
SOLUTION ELECTROSURG ANTI STCK (MISCELLANEOUS) ×3 IMPLANT
SPIKE FLUID TRANSFER (MISCELLANEOUS) ×3 IMPLANT
SURGILUBE 2OZ TUBE FLIPTOP (MISCELLANEOUS) IMPLANT
SUT MNCRL AB 4-0 PS2 18 (SUTURE) ×6 IMPLANT
SUT MON AB 3-0 SH27 (SUTURE) IMPLANT
SUT PDS PLUS AB 0 CT-2 (SUTURE) IMPLANT
SUT PROLENE 2 0 CT 1 (SUTURE) ×3 IMPLANT
SUT STRATAFIX SPIRAL PDS3-0 (SUTURE) IMPLANT
SUT VIC AB 0 CT1 27XBRD ANTBC (SUTURE) ×3 IMPLANT
SUT VIC AB 2-0 CT1 TAPERPNT 27 (SUTURE) ×3 IMPLANT
SUT VIC AB 2-0 SH 27XBRD (SUTURE) ×15 IMPLANT
SUT VIC AB 2-0 UR6 27 (SUTURE) ×3 IMPLANT
SUT VIC AB 3-0 SH 27X BRD (SUTURE) ×3 IMPLANT
SUT VIC AB 3-0 SH 27XBRD (SUTURE) ×3 IMPLANT
SUT VICRYL 0 UR6 27IN ABS (SUTURE) ×3 IMPLANT
SUTURE STRATFX SPIRAL 3-0 PDS+ (SUTURE) ×3 IMPLANT
SYR 10ML LL (SYRINGE) ×3 IMPLANT
SYR 50ML LL SCALE MARK (SYRINGE) ×3 IMPLANT
SYSTEM BAG RETRIEVAL 10MM (BASKET) IMPLANT
TOWEL OR 17X26 10 PK STRL BLUE (TOWEL DISPOSABLE) ×3 IMPLANT
TRAY LAPAROSCOPIC (CUSTOM PROCEDURE TRAY) ×3 IMPLANT
TROCAR Z THREAD OPTICAL 12X100 (TROCAR) ×3 IMPLANT
TROCAR Z-THREAD FIOS 5X100MM (TROCAR) ×3 IMPLANT
TUBING CONNECTING 10 (TUBING) ×3 IMPLANT
WATER STERILE IRR 1000ML POUR (IV SOLUTION) ×3 IMPLANT
WATER STERILE IRR 500ML POUR (IV SOLUTION) ×3 IMPLANT

## 2024-01-20 NOTE — Op Note (Signed)
 Preoperative diagnosis:   Pelvic organ prolapse Stress urinary incontinence  Postoperative diagnosis:   Same  Procedure: Robotic-assisted laparoscopic supracervical hysterectomy and  bilateral salpingo-oopherectomy Robotic-assisted laparoscopic sacrocolpopexy Mid-urethral sling cystoscopy  Surgeon: Andrez Banker, MD First assistant: Carrolyn Clan, PA-C  An assistant was required for this surgical procedure.  The duties of the assistant included but were not limited to suctioning, passing suture, camera manipulation, retraction. This procedure would not be able to be performed without an Geophysicist/field seismologist.   Anesthesia: General  Complications: None  Intraoperative findings:  [employer] Upsilon Y mesh used for the sacrocolpopexy.  EBL: 70 mL  Specimens: Uterus and proximal cervix, bilateral fallopian tubes and ovaries  Indication: Breanna [name] is a 53 y.o. female patient with symptomatic pelvic organ prolapse and urinary incontinence.    After reviewing the management options for treatment, she elected to proceed with the above surgical procedure(s). We have discussed the potential benefits and risks of the procedure, side effects of the proposed treatment, the likelihood of the patient achieving the goals of the procedure, and any potential problems that might occur during the procedure or recuperation. Informed consent has been obtained.  Description of procedure:  The patient was taken to the operating room and general anesthesia was induced.  The patient was placed in the dorsal lithotomy position, prepped and draped in the usual sterile fashion, and preoperative antibiotics were administered. A preoperative time-out was performed.    A Foley catheter was then placed and placed to gravity drainage. I then made a periumbilical incision carrying the dissection down to the patient's fascia with electrocautery.  Once to the fascia, the fascia was incised and a small puncture  hole made in the peritoneum to allow passage of a 8mm port.   The abdomen was insufflated and the remaining ports placed under digital guidance.  2 ports were placed lateral to the umbilicus on the right proximally 10 cm apart.  The most lateral port was approximately 3 cm above the anterior iliac spine.  2 additional ports were placed in the patient's right side in comparable positions to the most lateral port on the right was a 12 mm port.the robot was then docked at an angle from the leg obliquely along the side of the left leg.  We then began our surgery by cleaning up some of the pelvic adhesions to the small bowel and colon.  Once this was completed I started dissecting at the sacral promontory located 3 cm medial to the location where the ureter crosses over the right iliac vessels at the pelvic brim. The posterior peritoneum was incised and the sacral prominence cleared off an area taking care to avoid the middle sacral vessels and the iliac branches.  I then created a posterior peritoneal tunnel starting at the sacral promontory and tunneling down the right pelvic sidewall down into the pelvis breaking back through the posterior peritoneum around the vesico-vaginal junction posteriorly.  I then continued the posterior dissection retracting down on the rectum and finding the avascular plane between the posterior vaginal wall and the rectum.  I carried this dissection down as far as I could to along the area of the perineal body.  Then focused my attention to the uterus and hysterectomy.  I first started by taking the right round ligament with a series of by polar cautery.  I then dissected the anterior leaf of the broad ligament slightly more proximal and then distally down across the anterior mucosa to the salpinx and  the internal cervical os.  I then took of the uterine ovarian ligament on the right and dissected free the right salpinx. Once the anterior leaf of the broad ligament had been completely  dissected on the patient's right and a small bladder flap had been created anteriorly attention was turned to the left side where a similar dissection was carried out. I then turned my attention to the anterior plane between the anterior vaginal wall and the bladder.  I was able to obtain access to the avascular plane and with a combination of both monopolar cautery and blunt dissection was able to get down to the bladder neck.  I then turned my attention back to the patient's uterus and skeletonized the right uterine artery and vein and then took this with a series of bipolar moves.  I then performed a similar uterus pedicle ligation on the left.  At his point I was able to identify the patient's cervix and came through supracervical with monopolar cautery once the uterus was freed from all its attachments it was pushed into the left paracolic gutter and our attention was turned to placing the mesh.  Mesh was measured at approximately 7.5 cm anteriorly and 11 cm posteriorly and I cut this on the back table.  The mesh was then placed into the patient's abdomen through the assistant port and the anterior leaf was secured down onto the anterior vaginal wall with the apex at the bladder neck.  The posterior leaf was then secured down on the posterior vaginal wall.  These were sewn down with 2-0 Vicryl.  Between 6 and 8 were done on each side.  At this point I then went back to the previously dissected sacral promontory and posterior peritoneal tunnel and inserted a instrument through the tunnel and grasped the end of the mesh at the vaginal cuff and pull it up to the sacrum.  I then checked to ensure that the sacral mesh was not too tight by performing a vaginal exam.  I then secured the sacral leg of the mesh using a 0 Prolene.  I then reapproximated the posterior peritoneum with a 2-0 Vicryl in a running fashion around the sacral promontory.  The pelvic peritoneum was closed using a pursestring.  A small Endo Catch  bag was then gently passed through the assistant port and the uterus was placed in the bag.  The bag was then brought out through the camera port once the trochars were removed.  We then made a slightly larger extraction incision to remove the uterus.  The fascia was then closed with 0 Vicryl in a figure-of-eight fashion.  The skin was closed with 4-0 Monocryl's.  Dermabond was applied to the incision and exparel injected into the incisions.  The Foley catheter was then capped. The horseshoe [place] retractor was then applied to the drape and the Foley catheter was attached to it. Using the skin hooks for skin nodes were placed in the corners of the labia minora to open up the vaginal vestibule. 5 cc of quarter percent Marcaine with 1% epinephrine was then injected into the periurethral tissue. The suprapubic incisions were then marked out 1 cm lateral to midline and 1 cm above the pubic bone. Using a 15 blade a stab incision was made in both sides of midline. Gauze is placed over these areas to control the skin bleeding. A 1.5 cm incision was then made in the mid urethra through the vaginal mucosa. Using this Lee scissors dissection was  then carried out. Her urethra we laterally on both sides to the endopelvic fascia. The dissection was completed once there was enough space to place a finger through the incision on both sides of the urethra. Using the [facility] needle set both needles were passed through the stab incisions suprapubically down posterior to the pubic bone and then through the periurethral incisions using the index finger to guide the needle out of the incision. Once both needles had been placed the Foley catheter was removed and cystoscopy was performed. A 70 lens was passed gently through the patient's urethra and into the bladder under visual guidance. A 360 cystoscopic evaluation was performed. There was no mucosal abnormalities or evidence of perforation from the needles. The  cystoscope was then removed and the Foley catheter replaced. The bladder was then drained again and the Foley catheter. The ends of the sling were then attached to the needles and the needles pulled up through the retropubic space and out the suprapubic incision. Once the sling was noted to be centered, the blue plastic cap at the apex of the sling was cut and the plastic sheath was then pulled out. The mesh was noted to be well seated around the urethra. A right angle was used to ensure that the proper amount of tension for the sling around the urethra applied. The sling was noted to be tension free and well positioned. At this point copious amounts of double antibiotic irrigation was then used to irrigate the incision the periurethral space and the vagina. The incision was then closed with 2-0 Vicryl in a running fashion. The stab incisions in the suprapubic area were closed with Dermabond. The vagina was then packed with clindamycin impregnated vaginal packing. The patient was subsequently extubated and returned to the PACU in stable condition.  Estrace impregnated packing was then placed into her vagina which will be left in overnight.  The patient was subsequently extubated and returned to the PACU in excellent condition.

## 2024-01-20 NOTE — Plan of Care (Signed)

## 2024-01-20 NOTE — Anesthesia Postprocedure Evaluation (Signed)
 Anesthesia Post Note  Patient: Madelynn Schilder  Procedure(s) Performed: SACROCOLPOPEXY, ROBOT-ASSISTED, LAPAROSCOPIC (Abdomen) SUPRACERVICAL HYSTERECTOMY WITH BILATERAL SALPINGO OOPHORECTOMY (Bilateral: Abdomen) MID-URETHRAL SLING (Abdomen)     Patient location during evaluation: PACU Anesthesia Type: General Level of consciousness: awake and alert, patient cooperative and oriented Pain management: pain level controlled Vital Signs Assessment: post-procedure vital signs reviewed and stable Respiratory status: spontaneous breathing, nonlabored ventilation and respiratory function stable Cardiovascular status: blood pressure returned to baseline and stable Postop Assessment: no apparent nausea or vomiting Anesthetic complications: no   No notable events documented.  Last Vitals:  Vitals:   01/20/24 1330 01/20/24 1416  BP: 118/71   Pulse: 95 96  Resp: 18 11  Temp: 36.8 C   SpO2: 99% 100%    Last Pain:  Vitals:   01/20/24 1416  TempSrc:   PainSc: 4                  Rayder Sullenger,E. Daleyssa Loiselle

## 2024-01-20 NOTE — Discharge Instructions (Signed)
Activity:  You are encouraged to ambulate frequently (about every hour during waking hours) to help prevent blood clots from forming in your legs or lungs.  However, you should not engage in any heavy lifting (> 10-15 lbs), strenuous activity, or straining. Diet: You should advance your diet as instructed by your physician.  It will be normal to have some bloating, nausea, and abdominal discomfort intermittently. Prescriptions:  You will be provided a prescription for pain medication to take as needed.  If your pain is not severe enough to require the prescription pain medication, you may take extra strength Tylenol instead which will have less side effects.  You should also take a prescribed stool softener to avoid straining with bowel movements as the prescription pain medication may constipate you. Incisions: You may remove your dressing bandages 48 hours after surgery if not removed in the hospital.  You will either have some small staples or special tissue glue at each of the incision sites. Once the bandages are removed (if present), the incisions may stay open to air.  You may start showering (but not soaking or bathing in water) the 2nd day after surgery and the incisions simply need to be patted dry after the shower.  No additional care is needed. What to call us about: You should call the office [phone]) if you develop fever > 101 or develop persistent vomiting.   You may resume Mobic, aspirin, advil, aleve, vitamins, and supplements 7 days after surgery.

## 2024-01-20 NOTE — Anesthesia Procedure Notes (Signed)
 Procedure Name: Intubation Date/Time: 01/20/2024 7:45 AM  Performed by: Darlena Ego, CRNAPre-anesthesia Checklist: Patient identified, Emergency Drugs available, Suction available and Patient being monitored Patient Re-evaluated:Patient Re-evaluated prior to induction Oxygen Delivery Method: Circle System Utilized Preoxygenation: Pre-oxygenation with 100% oxygen Induction Type: IV induction Ventilation: Mask ventilation without difficulty Laryngoscope Size: Miller and 2 Grade View: Grade I Tube type: Oral Tube size: 7.0 mm Number of attempts: 1 Airway Equipment and Method: Stylet and Oral airway Placement Confirmation: ETT inserted through vocal cords under direct vision, positive ETCO2 and breath sounds checked- equal and bilateral Secured at: 21 cm Tube secured with: Tape Dental Injury: Teeth and Oropharynx as per pre-operative assessment

## 2024-01-20 NOTE — H&P (Signed)
 Pelvic organ prolapse consult   Her symptoms began this year about 6 months ago and include burning and vaginal irritation with walking including. The heating pad at night helps with the pain and discomfort. She also endorses significant pelvic pressure and lower back pain. Her urinary incontinence has gotten worse and progressed from urgency to incontinence with laugh, cough and sneezing. She is also having pain with sexual activity. At times she feels she does not fully empty her bladder and does suffer with constipation. She states that she has a history of rare stress urinary incontinence. Her most bothersome incontinence is actually urge urinary continence and nocturnal enuresis. She states that over the past several months, she has developed nocturia and rare nocturnal enuresis. She states that she can awaken her underwear are damp. She does report overactive symptoms during the daytime with urinary urgency and urinary urge incontinence. She does admit to drinking 3 cups of coffee a day, 1-2 teas a day and 1-2 sodas a day. He does not drink much alcohol.She has had 1 UTI which was associated with gross hematuria this year.   Initially she was thought to have a urethral diverticulum, and she has had an MRI and a pelvic ultrasound. On MRI there is no evidence of your urethral diverticulum.   Patient currently denies fever, chills, sweats, nausea, vomiting, abdominal or flank pain, gross hematuria or dysuria.   The patient has never had any pelvic surgery before, still has her uterus and all her female parts.   The patient's undergone urodynamics demonstrating a fairly small bladder capacity, with positive stress incontinence and evidence of detrusor overactivity.   Interval: Today the patient is here for preop evaluation. She is scheduled to undergo supracervical hysterectomy, sacrocolpopexy, and mid urethral sling. She has been working with pelvic floor physical therapy and has significantly less  pain. She also has less leakage. She is quite anxious to have this surgery.     ALLERGIES: No Known Drug Allergies    MEDICATIONS: Levothyroxine  Sodium 50 MCG Capsule  Myrbetriq 50 MG Tablet Extended Release 24 Hour 1 tablet PO Daily  Omeprazole 20 MG Tablet Delayed Release  Albuterol Sulfate HFA 108 (90 Base) MCG/ACT Aerosol Solution  Amoxicillin -Pot Clavulanate 875-125 MG Tablet  Calcium  Centrum Adult  Escitalopram  Oxalate 20 MG Tablet  Ezetimibe  10 MG Tablet  Fish Oil  Flonase  Allergy Relief  Fluticasone  Propionate 50 MCG/ACT Suspension  Imvexxy Starter Pack 10 MCG Insert  Levocetirizine Dihydrochloride  5 MG Tablet  Levothyroxine   Meloxicam 15 MG Tablet  methylPREDNISolone Sodium Succ  Montelukast  Sodium 10 MG Tablet  Multivitamin  Rosabello Beetroot Supplement  Singulair      GU PSH: Complex cystometrogram, w/ void pressure and urethral pressure profile studies, any technique - 08/02/2023 Complex Uroflow - 08/02/2023 Emg surf Electrd - 08/02/2023 Inject For cystogram - 08/02/2023 Intrabd voidng Press - 08/02/2023     NON-GU PSH: Back surgery Visit Complexity (formerly GPC1X) - 09/01/2023, 07/18/2023     GU PMH: Cystocele, Unspec - 12/21/2023, - 11/08/2023, - 10/27/2023, - 09/01/2023, - 07/18/2023 Mixed incontinence - 12/21/2023, - 11/22/2023, - 11/08/2023, - 10/27/2023, - 10/10/2023 Pelvic/perineal pain - 12/21/2023, - 11/22/2023, - 11/08/2023, - 10/10/2023, - 09/01/2023 Urinary Frequency - 12/21/2023, - 10/10/2023 Rectocele - 10/27/2023, - 10/10/2023, - 09/01/2023, - 07/18/2023 Low back pain - 10/10/2023, - 2022 Stress Incontinence - 09/01/2023 Urge incontinence - 08/02/2023, (Stable), - 07/18/2023, - 2022 Urinary Urgency - 08/02/2023, - 2022 Postmenopausal atrophic vaginitis - 07/18/2023 Flank Pain - 2022 Nocturia - 2022  Nocturnal Enuresis - 2022 Renal calculus - 2022 Renal cyst - 2022    NON-GU PMH: Muscle weakness (generalized) - 12/21/2023, - 11/22/2023, - 11/08/2023, - 10/27/2023, -  10/10/2023 Other muscle spasm - 11/22/2023, - 11/08/2023, - 10/27/2023, - 10/10/2023 Other specified disorders of muscle - 11/22/2023, - 11/08/2023, - 10/27/2023 Anxiety GERD Hypercholesterolemia Hypothyroidism    FAMILY HISTORY: 1 Daughter - Runs in Family 1 son - Runs in Family Kidney Stones - Runs in Family osteoarthritis - Runs in Family   SOCIAL HISTORY: Marital Status: Married Ethnicity: Not Hispanic Or Latino; Race: White Current Smoking Status: Patient has never smoked.   Tobacco Use Assessment Completed: Used Tobacco in last 30 days? Social Drinker.  Drinks 3 caffeinated drinks per day.    REVIEW OF SYSTEMS:    GU Review Female:   Patient denies frequent urination, hard to postpone urination, burning /pain with urination, get up at night to urinate, leakage of urine, stream starts and stops, trouble starting your stream, have to strain to urinate, and being pregnant.  Gastrointestinal (Upper):   Patient denies nausea, vomiting, and indigestion/ heartburn.  Gastrointestinal (Lower):   Patient denies diarrhea and constipation.  Constitutional:   Patient denies fever, night sweats, weight loss, and fatigue.  Skin:   Patient denies skin rash/ lesion and itching.  Eyes:   Patient denies blurred vision and double vision.  Ears/ Nose/ Throat:   Patient denies sore throat and sinus problems.  Hematologic/Lymphatic:   Patient denies swollen glands and easy bruising.  Cardiovascular:   Patient denies leg swelling and chest pains.  Respiratory:   Patient denies cough and shortness of breath.  Endocrine:   Patient denies excessive thirst.  Musculoskeletal:   Patient denies back pain and joint pain.  Neurological:   Patient denies headaches and dizziness.  Psychologic:   Patient denies depression and anxiety.   VITAL SIGNS:      01/03/2024 02:48 PM  Weight 207 lb / 93.89 kg  BP 135/83 mmHg  Pulse 86 /min   MULTI-SYSTEM PHYSICAL EXAMINATION:    Constitutional: Well-nourished. No  physical deformities. Normally developed. Good grooming.  Respiratory: Normal breath sounds. No labored breathing, no use of accessory muscles.   Cardiovascular: Regular rate and rhythm. No murmur, no gallop. Normal temperature, normal extremity pulses, no swelling, no varicosities.      Complexity of Data:  Source Of History:  Patient  Records Review:   Previous Doctor Records, Previous Patient Records, POC Tool  Urine Test Review:   Urinalysis  Urodynamics Review:   Review Bladder Scan, Review Urodynamics Tests   PROCEDURES:          Visit Complexity - G2211          Urinalysis Dipstick Dipstick Cont'd  Color: Yellow Bilirubin: Neg mg/dL  Appearance: Clear Ketones: Neg mg/dL  Specific Gravity: 1.610 Blood: Neg ery/uL  pH: 6.0 Protein: Neg mg/dL  Glucose: Neg mg/dL Urobilinogen: 0.2 mg/dL    Nitrites: Neg    Leukocyte Esterase: Neg leu/uL    ASSESSMENT:      ICD-10 Details  1 GU:   Cystocele, Unspec - N81.10   2   Mixed incontinence - N39.46   3   Low back pain - M54.5    PLAN:           Document Letter(s):  Created for Patient: Clinical Summary         Notes:   I went over robotic-assisted laparoscopic sacral colpopexy with the patient detail. I explained to  the patient the rationale for the surgery. I also went over the placement of the laparoscopic ports. I detailed to her the surgery as well as the postoperative recovery time. I explained to the patient that she could expect to be in the hospital at least one or 2 nights. She will require 4 weeks of no heavy lifting, 6 weeks of no bending or twisting. She will not be able to use her vagina for 6 weeks. I discussed complications of the operation including injury to bowel, ureters, bladder. We also discussed the risk of failure as well as the complications of mesh. I explained to them the difference between transvaginal mesh and the mesh used for sacral colpopexy. I reassured them that there has not been an FDA warnings in  regards to sacral colpopexy mesh. We will plan to get this prior to her surgery. I spent 45 minutes with the patient going over that ins and outs of the surgery and answering all her questions.     The patient will also need a concomitant supracervical hysterectomy and bilateral salpingo-oophorectomy. Further, given that she has stress incontinence, we have opted to also proceed with a mid urethral sling.

## 2024-01-20 NOTE — Transfer of Care (Signed)
 Immediate Anesthesia Transfer of Care Note  Patient: Breanna [name]  Procedure(s) Performed: SACROCOLPOPEXY, ROBOT-ASSISTED, LAPAROSCOPIC (Abdomen) SUPRACERVICAL HYSTERECTOMY WITH BILATERAL SALPINGO OOPHORECTOMY (Bilateral: Abdomen) MID-URETHRAL SLING (Abdomen)  Patient Location: PACU  Anesthesia Type:General  Level of Consciousness: sedated and responds to stimulation  Airway & Oxygen Therapy: Patient Spontanous Breathing and Patient connected to nasal cannula oxygen  Post-op Assessment: Report given to RN and Post -op Vital signs reviewed and stable  Post vital signs: Reviewed and stable  Last Vitals:  Vitals Value Taken Time  BP 135/77 01/20/24 1220  Temp 36.7 C 01/20/24 1220  Pulse 99 01/20/24 1223  Resp 21 01/20/24 1223  SpO2 95 % 01/20/24 1223  Vitals shown include unfiled device data.  Last Pain:  Vitals:   01/20/24 1220  TempSrc:   PainSc: 0-No pain         Complications: No notable events documented.

## 2024-01-21 ENCOUNTER — Encounter (HOSPITAL_COMMUNITY): Payer: Self-pay | Admitting: Urology

## 2024-01-21 DIAGNOSIS — N393 Stress incontinence (female) (male): Secondary | ICD-10-CM | POA: Diagnosis not present

## 2024-01-21 LAB — BASIC METABOLIC PANEL WITH GFR
Anion gap: 8 (ref 5–15)
BUN: 9 mg/dL (ref 6–20)
CO2: 25 mmol/L (ref 22–32)
Calcium: 8.6 mg/dL — ABNORMAL LOW (ref 8.9–10.3)
Chloride: 101 mmol/L (ref 98–111)
Creatinine, Ser: 0.59 mg/dL (ref 0.44–1.00)
GFR, Estimated: >60 mL/min (ref >60)
Glucose, Bld: 128 mg/dL — ABNORMAL HIGH (ref 70–99)
Potassium: 3.6 mmol/L (ref 3.5–5.1)
Sodium: 134 mmol/L — ABNORMAL LOW (ref 135–145)

## 2024-01-21 LAB — HEMOGLOBIN AND HEMATOCRIT, BLOOD
HCT: 35.8 % — ABNORMAL LOW (ref 36.0–46.0)
Hemoglobin: 11.2 g/dL — ABNORMAL LOW (ref 12.0–15.0)

## 2024-01-21 NOTE — Discharge Summary (Signed)
 Date of admission: 01/20/2024  Date of discharge: 01/21/2024  Admission diagnosis:  Cystocele with prolapse [N81.4]   Discharge diagnosis:  Cystocele with prolapse [N81.4]  Secondary diagnoses:   Active Ambulatory Problems    Diagnosis Date Noted   Near syncope    Chronic bilateral low back pain    Lower GI bleed    Upper GI bleed    Gastroesophageal reflux disease    BMI 36.0-36.9,adult 08/19/2019   Hypokalemia/hypomagnesia 08/02/2022   Hypomagnesemia 08/02/2022   SIRS (systemic inflammatory response syndrome) (HCC) 08/02/2022   GAD (generalized anxiety disorder) 09/12/2017   Mild intermittent asthma without complication 10/02/2018   Prolonged QT interval 08/02/2022   Hypothyroidism 08/02/2022   Resolved Ambulatory Problems    Diagnosis Date Noted   Hematochezia 02/03/2017   Abdominal pain    Cystitis    Lab test positive for detection of COVID-19 virus 08/19/2019   Past Medical History:  Diagnosis Date   Allergic rhinitis    Anxiety    Back pain    Bronchitis    Chest pain    Depressed    Diverticulitis    Hyperlipemia      History and Physical: For full details, please see admission history and physical. Briefly, Breanna [name] is a 53 y.o. year old patient who was admitted s/p robotic ASC.   Hospital Course: POD 1 foley removed and able to void  NAD, Aox3 RRR CTAB Abd soft, ND, appropriately tender  On the day of discharge, the patient was tolerating a regular diet and their pain was well controlled. They were determined to be stable for discharge home  Laboratory values:  Recent Labs    01/20/24 1259 01/21/24 0405  HGB 12.4 11.2*  HCT 39.3 35.8*   Recent Labs    01/20/24 1259 01/21/24 0405  CREATININE 0.69 0.59    Disposition: Home  Discharge medications:  Allergies as of 01/21/2024       Reactions   Trichophyton Itching, Other (See Comments)   Rhinitis        Medication List     STOP taking these medications     amoxicillin -clavulanate 875-125 MG tablet Commonly known as: AUGMENTIN   calcium citrate 950 (200 Ca) MG tablet Commonly known as: CALCITRATE - dosed in mg elemental calcium   Centrum Silver 50+Women Tabs   Garlique 400 MG Tbec Generic drug: Garlic   ketorolac  10 MG tablet Commonly known as: TORADOL    meloxicam 15 MG tablet Commonly known as: MOBIC   phenol 1.4 % Liqd Commonly known as: CHLORASEPTIC       TAKE these medications    acetaminophen  650 MG CR tablet Commonly known as: TYLENOL  Take 650 mg by mouth every 8 (eight) hours as needed for pain.   albuterol  108 (90 Base) MCG/ACT inhaler Commonly known as: VENTOLIN  HFA Inhale 2 puffs into the lungs every 4 (four) hours as needed for wheezing or shortness of breath.   docusate sodium  100 MG capsule Commonly known as: COLACE Take 1 capsule (100 mg total) by mouth 2 (two) times daily.   escitalopram  20 MG tablet Commonly known as: LEXAPRO  Take 20 mg by mouth in the morning.   fluticasone  50 MCG/ACT nasal spray Commonly known as: FLONASE  Place 1 spray into both nostrils 2 (two) times daily as needed for allergies or rhinitis.   HYDROcodone -acetaminophen  5-325 MG tablet Commonly known as: NORCO/VICODIN Take 1-2 tablets by mouth every 6 (six) hours as needed for moderate pain (pain score 4-6) or severe  pain (pain score 7-10).   Imvexxy  Maintenance Pack 10 MCG Inst Generic drug: Estradiol  Place 10 mcg vaginally 2 (two) times a week.   levocetirizine 5 MG tablet Commonly known as: XYZAL  Take 5 mg by mouth at bedtime.   levothyroxine  50 MCG tablet Commonly known as: SYNTHROID  Take 50 mcg by mouth daily before breakfast.   MAGNESIUM  PO Take 1 tablet by mouth daily as needed (cramps).   mirabegron ER 50 MG Tb24 tablet Commonly known as: MYRBETRIQ Take 50 mg by mouth daily.   montelukast  10 MG tablet Commonly known as: SINGULAIR  Take 10 mg by mouth at bedtime.   omeprazole 20 MG capsule Commonly  known as: PRILOSEC Take 20 mg by mouth daily before lunch.   potassium chloride  SA 20 MEQ tablet Commonly known as: KLOR-CON  M Take 1 tablet (20 mEq total) by mouth daily for 7 days.   sulfamethoxazole -trimethoprim  800-160 MG tablet Commonly known as: BACTRIM  DS Take 1 tablet by mouth 2 (two) times daily.   Zetia  10 MG tablet Generic drug: ezetimibe  Take 10 mg by mouth at bedtime.        Followup:   Follow-up Information     Aquilla Knapp, NP Follow up on 02/03/2024.   Specialty: Nurse Practitioner Why: at 9:30 Contact information: [address] [place] [address] [place] [address] [phone]

## 2024-01-21 NOTE — Progress Notes (Signed)
 Patient alert, vaginal packing removed with minimal drainage on the bedpad, bright red color and foley dc'd at 0600 am. Patient ambulating in the unit tolerating well with patient husband supervision.

## 2024-01-21 NOTE — Progress Notes (Signed)
   01/21/24 1304  TOC Brief Assessment  Insurance and Status Reviewed  Patient has primary care physician Yes  Home environment has been reviewed single family home  Prior level of function: independent  Prior/Current Home Services No current home services  Social Drivers of Health Review SDOH reviewed no interventions necessary  Readmission risk has been reviewed Yes  Transition of care needs no transition of care needs at this time    [place], MSW, LCSW 01/21/2024 1:05 PM

## 2024-01-21 NOTE — Progress Notes (Signed)
 AVS given to patient and explained at the bedside. Medications and follow up appointments have been explained with pt verbalizing understanding.

## 2024-01-23 LAB — SURGICAL PATHOLOGY

## 2024-07-11 ENCOUNTER — Emergency Department (HOSPITAL_COMMUNITY)

## 2024-07-11 ENCOUNTER — Emergency Department (HOSPITAL_COMMUNITY)
Admission: EM | Admit: 2024-07-11 | Discharge: 2024-07-11 | Disposition: A | Discharge status: Home / Self Care | Attending: Emergency Medicine | Admitting: Emergency Medicine

## 2024-07-11 ENCOUNTER — Encounter (HOSPITAL_COMMUNITY): Payer: Self-pay

## 2024-07-11 ENCOUNTER — Other Ambulatory Visit: Payer: Self-pay

## 2024-07-11 DIAGNOSIS — R748 Abnormal levels of other serum enzymes: Secondary | ICD-10-CM | POA: Diagnosis not present

## 2024-07-11 DIAGNOSIS — R1013 Epigastric pain: Secondary | ICD-10-CM | POA: Diagnosis not present

## 2024-07-11 DIAGNOSIS — R1012 Left upper quadrant pain: Secondary | ICD-10-CM | POA: Diagnosis present

## 2024-07-11 DIAGNOSIS — R112 Nausea with vomiting, unspecified: Secondary | ICD-10-CM | POA: Insufficient documentation

## 2024-07-11 DIAGNOSIS — R109 Unspecified abdominal pain: Secondary | ICD-10-CM

## 2024-07-11 LAB — CBC WITH DIFFERENTIAL/PLATELET
Abs Immature Granulocytes: 0.03 K/uL (ref 0.00–0.07)
Basophils Absolute: 0.1 K/uL (ref 0.0–0.1)
Basophils Relative: 1 %
Eosinophils Absolute: 0.4 K/uL (ref 0.0–0.5)
Eosinophils Relative: 4 %
HCT: 43.2 % (ref 36.0–46.0)
Hemoglobin: 13.7 g/dL (ref 12.0–15.0)
Immature Granulocytes: 0 %
Lymphocytes Relative: 43 %
Lymphs Abs: 4.4 K/uL — ABNORMAL HIGH (ref 0.7–4.0)
MCH: 27.1 pg (ref 26.0–34.0)
MCHC: 31.7 g/dL (ref 30.0–36.0)
MCV: 85.5 fL (ref 80.0–100.0)
Monocytes Absolute: 0.8 K/uL (ref 0.1–1.0)
Monocytes Relative: 8 %
Neutro Abs: 4.5 K/uL (ref 1.7–7.7)
Neutrophils Relative %: 44 %
Platelets: 357 K/uL (ref 150–400)
RBC: 5.05 MIL/uL (ref 3.87–5.11)
RDW: 13.8 % (ref 11.5–15.5)
WBC: 10.3 K/uL (ref 4.0–10.5)
nRBC: 0 % (ref 0.0–0.2)

## 2024-07-11 LAB — COMPREHENSIVE METABOLIC PANEL WITH GFR
ALT: 19 U/L (ref 0–44)
AST: 23 U/L (ref 15–41)
Albumin: 4.2 g/dL (ref 3.5–5.0)
Alkaline Phosphatase: 87 U/L (ref 38–126)
Anion gap: 11 (ref 5–15)
BUN: 12 mg/dL (ref 6–20)
CO2: 27 mmol/L (ref 22–32)
Calcium: 9.4 mg/dL (ref 8.9–10.3)
Chloride: 102 mmol/L (ref 98–111)
Creatinine, Ser: 0.69 mg/dL (ref 0.44–1.00)
GFR, Estimated: >60 mL/min (ref >60)
Glucose, Bld: 81 mg/dL (ref 70–99)
Potassium: 3.5 mmol/L (ref 3.5–5.1)
Sodium: 140 mmol/L (ref 135–145)
Total Bilirubin: 0.2 mg/dL (ref 0.0–1.2)
Total Protein: 7.6 g/dL (ref 6.5–8.1)

## 2024-07-11 LAB — URINALYSIS, ROUTINE W REFLEX MICROSCOPIC
Bilirubin Urine: NEGATIVE
Glucose, UA: NEGATIVE mg/dL
Hgb urine dipstick: NEGATIVE
Ketones, ur: NEGATIVE mg/dL
Leukocytes,Ua: NEGATIVE
Nitrite: NEGATIVE
Protein, ur: NEGATIVE mg/dL
Specific Gravity, Urine: 1.015 (ref 1.005–1.030)
pH: 5 (ref 5.0–8.0)

## 2024-07-11 LAB — LIPASE, BLOOD: Lipase: 72 U/L — ABNORMAL HIGH (ref 11–51)

## 2024-07-11 MED ORDER — IOHEXOL 300 MG/ML  SOLN
100.0000 mL | Freq: Once | INTRAMUSCULAR | Status: AC | PRN
  Administered 2024-07-11: 100 mL via INTRAVENOUS

## 2024-07-11 MED ORDER — MORPHINE SULFATE (PF) 4 MG/ML IV SOLN
4.0000 mg | Freq: Once | INTRAVENOUS | Status: AC
  Administered 2024-07-11: 4 mg via INTRAVENOUS
  Filled 2024-07-11: qty 1

## 2024-07-11 MED ORDER — ONDANSETRON HCL 4 MG/2ML IJ SOLN
4.0000 mg | Freq: Once | INTRAMUSCULAR | Status: AC
Start: 2024-07-11 — End: 2024-07-11
  Administered 2024-07-11: 4 mg via INTRAVENOUS
  Filled 2024-07-11: qty 2

## 2024-07-11 MED ORDER — SODIUM CHLORIDE 0.9 % IV BOLUS
1000.0000 mL | Freq: Once | INTRAVENOUS | Status: AC
Start: 2024-07-11 — End: 2024-07-11
  Administered 2024-07-11: 1000 mL via INTRAVENOUS

## 2024-07-11 MED ORDER — ONDANSETRON 4 MG PO TBDP: 4.0000 mg | ORAL_TABLET | Freq: Three times a day (TID) | ORAL | 0 refills | Status: AC | PRN

## 2024-07-11 MED ORDER — OXYCODONE HCL 5 MG PO TABS: 5.0000 mg | ORAL_TABLET | ORAL | 0 refills | Status: AC | PRN

## 2024-07-11 NOTE — ED Triage Notes (Addendum)
 Pt reports with sharp, aching, throbbing LLQ pain, diarrhea, and vomiting since yesterday. Pt took Tylenol  for pain. Pt reports recently finishing abts for diverticulitis flare up. She was not taking them as prescribed due to the effects they had on her.

## 2024-07-11 NOTE — Discharge Instructions (Addendum)
 Your evaluation did not find a clear cause for your abdominal pain.  If pain comes back and it is not controlled by oxycodone , please return to the emergency department for reevaluation.

## 2024-07-11 NOTE — ED Provider Notes (Signed)
 [place] [facility] AT [facility] Provider Note   CSN: [id number] Arrival date & time: 07/11/24  9964     Patient presents with: Abdominal Pain   Breanna [name] is a 53 y.o. female.   The history is provided by the patient.  Abdominal Pain  She has history of hyperlipidemia, diverticulitis and comes in because of pain in the left mid abdomen.  She was awakened last night by severe pain with associated nausea and vomiting.  However, symptoms subsided during the day but got worse after she ate dinner.  Pain got worse and nausea got worse.  She has vomited several times.  She has also had diarrhea through the day estimating 4 loose to watery bowel movements.  She denies fever but did have some chills.  She denies any sweats.  She denies dysuria or urinary urgency or frequency.  She has been having similar pains in the past and was treated for presumptive diverticulitis with a course of antibiotics.    Prior to Admission medications   Medication Sig Start Date End Date Taking? Authorizing Provider  acetaminophen  (TYLENOL ) 650 MG CR tablet Take 650 mg by mouth every 8 (eight) hours as needed for pain.    [provider]  albuterol  (VENTOLIN  HFA) 108 (90 Base) MCG/ACT inhaler Inhale 2 puffs into the lungs every 4 (four) hours as needed for wheezing or shortness of breath.    [provider]  docusate sodium  (COLACE) 100 MG capsule Take 1 capsule (100 mg total) by mouth 2 (two) times daily. 01/20/24   Cory Palma, PA-C  escitalopram  (LEXAPRO ) 20 MG tablet Take 20 mg by mouth in the morning.    [provider]  ezetimibe  (ZETIA ) 10 MG tablet Take 10 mg by mouth at bedtime. 09/12/17   [provider]  fluticasone  (FLONASE ) 50 MCG/ACT nasal spray Place 1 spray into both nostrils 2 (two) times daily as needed for allergies or rhinitis. 01/08/17   [provider]  HYDROcodone -acetaminophen  (NORCO/VICODIN) 5-325 MG tablet Take 1-2  tablets by mouth every 6 (six) hours as needed for moderate pain (pain score 4-6) or severe pain (pain score 7-10). 01/20/24   Dancy, Palma, PA-C  IMVEXXY  MAINTENANCE PACK 10 MCG INST Place 10 mcg vaginally 2 (two) times a week. 11/10/23   [provider]  levocetirizine (XYZAL ) 5 MG tablet Take 5 mg by mouth at bedtime. 12/08/17   [provider]  levothyroxine  (SYNTHROID , LEVOTHROID) 50 MCG tablet Take 50 mcg by mouth daily before breakfast. 12/29/16   [provider]  MAGNESIUM  PO Take 1 tablet by mouth daily as needed (cramps).    [provider]  mirabegron ER (MYRBETRIQ) 50 MG TB24 tablet Take 50 mg by mouth daily.    [provider]  montelukast  (SINGULAIR ) 10 MG tablet Take 10 mg by mouth at bedtime.  01/21/17   [provider]  omeprazole (PRILOSEC) 20 MG capsule Take 20 mg by mouth daily before lunch.    [provider]  potassium chloride  SA (KLOR-CON  M) 20 MEQ tablet Take 1 tablet (20 mEq total) by mouth daily for 7 days. Patient not taking: Reported on 01/06/2024 08/04/22 08/11/22  Raenelle Coria, MD  sulfamethoxazole -trimethoprim  (BACTRIM  DS) 800-160 MG tablet Take 1 tablet by mouth 2 (two) times daily. 01/20/24   Cory Palma, PA-C    Allergies: Trichophyton    Review of Systems  Gastrointestinal:  Positive for abdominal pain.  All other systems reviewed and are negative.  Updated Vital Signs BP (!) 162/92 (BP Location: Left Arm)   Pulse 92   Temp 98.3 F (36.8 C) (Oral)   Resp 18   LMP 08/24/2011 Comment: negative urine pregnancy test 02-06-2018  SpO2 100%   Physical Exam Vitals and nursing note reviewed.   53 year old female, resting comfortably and in no acute distress. Vital signs are significant for elevated blood pressure. Oxygen saturation is 100%, which is normal. Head is normocephalic and atraumatic. PERRLA, EOMI. Oropharynx is clear. Neck is nontender and supple without adenopathy. Back is nontender and  there is no CVA tenderness. Lungs are clear without rales, wheezes, or rhonchi. Chest is nontender. Heart has regular rate and rhythm without murmur. Abdomen is soft, flat, with moderate to marked tenderness in the left mid abdomen with lesser degrees of tenderness in the left upper abdomen and epigastric area and into the right upper quadrant.  There is no rebound or guarding. Extremities have no cyanosis or edema, full range of motion is present. Skin is warm and dry without rash. Neurologic: Mental status is normal, cranial nerves are intact, moves all extremities equally.  (all labs ordered are listed, but only abnormal results are displayed) Labs Reviewed  LIPASE, BLOOD - Abnormal; Notable for the following components:      Result Value   Lipase 72 (*)    All other components within normal limits  CBC WITH DIFFERENTIAL/PLATELET - Abnormal; Notable for the following components:   Lymphs Abs 4.4 (*)    All other components within normal limits  COMPREHENSIVE METABOLIC PANEL WITH GFR  URINALYSIS, ROUTINE W REFLEX MICROSCOPIC    EKG: None  Radiology: No results found.   Procedures   Medications Ordered in the ED - No data to display                                  Medical Decision Making Amount and/or Complexity of Data Reviewed Labs: ordered. Radiology: ordered.  Risk Prescription drug management.   Left mid and upper abdominal pain.  This a presentation with a wide range of treatment options and carries with it a high risk of morbidity and complications.  Differential diagnosis includes, but is not limited to, diverticulitis, pyelonephritis, urolithiasis with renal colic, gastroenteritis, peptic ulcer disease, cholecystitis, pancreatitis.  I have reviewed her laboratory tests, and my interpretation is normal comprehensive metabolic panel, mildly elevated lipase of doubtful clinical significance, normal CBC including normal differential, normal urinalysis.  I have  ordered morphine  for pain, ondansetron  for nausea, IV fluids and I have ordered CT of abdomen and pelvis to further evaluate her pain and tenderness.  I reviewed her past records, and CT scans on 08/02/2022 and 02/06/2018 showed diverticulosis but no acute intra-abdominal pathology.  She feels much better following above-noted treatment.  CT of abdomen and pelvis showed no acute findings.  Incidental findings of umbilical hernia, diverticulosis, nonobstructing right renal calculus, 6 cm Bosniak parapelvic right renal cyst.  I have independently viewed the images, and agree with the radiologist's interpretation.  I have explained the findings to the patient.  I am discharging her with prescriptions for ondansetron  for nausea and a small number of oxycodone  tablets.  She is to return for pain not controlled by oxycodone  or pain that persists.  Otherwise follow-up with PCP.     Final diagnoses:  Left sided abdominal pain  Elevated lipase    ED Discharge Orders  Ordered    ondansetron  (ZOFRAN -ODT) 4 MG disintegrating tablet  Every 8 hours PRN        07/11/24 0538    oxyCODONE  (ROXICODONE ) 5 MG immediate release tablet  Every 4 hours PRN        07/11/24 0538               Raford Lenis, MD 07/11/24 [phone]
